# Patient Record
Sex: Male | Born: 1980 | Race: White | Hispanic: No | Marital: Single | Smoking: Current every day smoker
Health system: Southern US, Community
[De-identification: ages and names within clinical notes are randomized; demographics above are authoritative.]

## PROBLEM LIST (undated history)

## (undated) DIAGNOSIS — F419 Anxiety disorder, unspecified: Secondary | ICD-10-CM

## (undated) DIAGNOSIS — M1990 Unspecified osteoarthritis, unspecified site: Secondary | ICD-10-CM

## (undated) DIAGNOSIS — M2550 Pain in unspecified joint: Secondary | ICD-10-CM

## (undated) HISTORY — DX: Anxiety disorder, unspecified: F41.9

## (undated) HISTORY — DX: Pain in unspecified joint: M25.50

---

## 2014-01-19 ENCOUNTER — Encounter (HOSPITAL_COMMUNITY): Payer: Self-pay | Admitting: Emergency Medicine

## 2014-01-19 ENCOUNTER — Emergency Department (HOSPITAL_COMMUNITY): Payer: Self-pay

## 2014-01-19 ENCOUNTER — Emergency Department (HOSPITAL_COMMUNITY)
Admission: EM | Admit: 2014-01-19 | Discharge: 2014-01-19 | Disposition: A | Discharge status: Home / Self Care | Payer: Self-pay | Attending: Emergency Medicine | Admitting: Emergency Medicine

## 2014-01-19 DIAGNOSIS — S2249XA Multiple fractures of ribs, unspecified side, initial encounter for closed fracture: Secondary | ICD-10-CM | POA: Insufficient documentation

## 2014-01-19 DIAGNOSIS — Y9356 Activity, jumping rope: Secondary | ICD-10-CM | POA: Insufficient documentation

## 2014-01-19 DIAGNOSIS — Y929 Unspecified place or not applicable: Secondary | ICD-10-CM | POA: Insufficient documentation

## 2014-01-19 DIAGNOSIS — W1789XA Other fall from one level to another, initial encounter: Secondary | ICD-10-CM | POA: Insufficient documentation

## 2014-01-19 DIAGNOSIS — Z7982 Long term (current) use of aspirin: Secondary | ICD-10-CM | POA: Insufficient documentation

## 2014-01-19 DIAGNOSIS — F17200 Nicotine dependence, unspecified, uncomplicated: Secondary | ICD-10-CM | POA: Insufficient documentation

## 2014-01-19 DIAGNOSIS — S2232XA Fracture of one rib, left side, initial encounter for closed fracture: Secondary | ICD-10-CM

## 2014-01-19 DIAGNOSIS — Z79899 Other long term (current) drug therapy: Secondary | ICD-10-CM | POA: Insufficient documentation

## 2014-01-19 MED ORDER — OXYCODONE-ACETAMINOPHEN 5-325 MG PO TABS
1.0000 | ORAL_TABLET | Freq: Once | ORAL | Status: AC
  Administered 2014-01-19: 1 via ORAL
  Filled 2014-01-19: qty 1

## 2014-01-19 MED ORDER — KETOROLAC TROMETHAMINE 60 MG/2ML IM SOLN
60.0000 mg | Freq: Once | INTRAMUSCULAR | Status: AC
  Administered 2014-01-19: 60 mg via INTRAMUSCULAR
  Filled 2014-01-19: qty 2

## 2014-01-19 MED ORDER — OXYCODONE-ACETAMINOPHEN 5-325 MG PO TABS: 1.0000 | ORAL_TABLET | Freq: Four times a day (QID) | ORAL | Status: DC | PRN

## 2014-01-19 MED ORDER — IBUPROFEN 600 MG PO TABS: 600.0000 mg | ORAL_TABLET | Freq: Four times a day (QID) | ORAL | Status: DC | PRN

## 2014-01-19 NOTE — ED Notes (Signed)
PA at the bedside.

## 2014-01-19 NOTE — Discharge Instructions (Signed)
Rib Fracture  A rib fracture is a break or crack in one of the bones of the ribs. The ribs are a group of long, curved bones that wrap around your chest and attach to your spine. They protect your lungs and other organs in the chest cavity. A broken or cracked rib is often painful, but most do not cause other problems. Most rib fractures heal on their own over time. However, rib fractures can be more serious if multiple ribs are broken or if broken ribs move out of place and push against other structures.  CAUSES   · A direct blow to the chest. For example, this could happen during contact sports, a car accident, or a fall against a hard object.  · Repetitive movements with high force, such as pitching a baseball or having severe coughing spells.  SYMPTOMS   · Pain when you breathe in or cough.  · Pain when someone presses on the injured area.  DIAGNOSIS   Your caregiver will perform a physical exam. Various imaging tests may be ordered to confirm the diagnosis and to look for related injuries. These tests may include a chest X-ray, computed tomography (CT), magnetic resonance imaging (MRI), or a bone scan.  TREATMENT   Rib fractures usually heal on their own in 1 3 months. The longer healing period is often associated with a continued cough or other aggravating activities. During the healing period, pain control is very important. Medication is usually given to control pain. Hospitalization or surgery may be needed for more severe injuries, such as those in which multiple ribs are broken or the ribs have moved out of place.   HOME CARE INSTRUCTIONS   · Avoid strenuous activity and any activities or movements that cause pain. Be careful during activities and avoid bumping the injured rib.  · Gradually increase activity as directed by your caregiver.  · Only take over-the-counter or prescription medications as directed by your caregiver. Do not take other medications without asking your caregiver first.  · Apply ice  to the injured area for the first 1 2 days after you have been treated or as directed by your caregiver. Applying ice helps to reduce inflammation and pain.  · Put ice in a plastic bag.  · Place a towel between your skin and the bag.    · Leave the ice on for 15 20 minutes at a time, every 2 hours while you are awake.  · Perform deep breathing as directed by your caregiver. This will help prevent pneumonia, which is a common complication of a broken rib. Your caregiver may instruct you to:  · Take deep breaths several times a day.  · Try to cough several times a day, holding a pillow against the injured area.  · Use a device called an incentive spirometer to practice deep breathing several times a day.  · Drink enough fluids to keep your urine clear or pale yellow. This will help you avoid constipation.    · Do not wear a rib belt or binder. These restrict breathing, which can lead to pneumonia.    SEEK IMMEDIATE MEDICAL CARE IF:   · You have a fever.    · You have difficulty breathing or shortness of breath.    · You develop a continual cough, or you cough up thick or bloody sputum.  · You feel sick to your stomach (nausea), throw up (vomit), or have abdominal pain.    · You have worsening pain not controlled with medications.      MAKE SURE YOU:  · Understand these instructions.  · Will watch your condition.  · Will get help right away if you are not doing well or get worse.  Document Released: 07/26/2005 Document Revised: 03/28/2013 Document Reviewed: 09/27/2012  [employer]® Patient Information ©2014 [employer].

## 2014-01-19 NOTE — ED Provider Notes (Signed)
CSN: [id number]     Arrival date & time 01/19/14  1102 History  This chart was scribed for non-physician practitioner, Junius FinnerErin O'Malley, PA-C,working with Audree CamelScott T Goldston, MD, by Karle PlumberJennifer Tensley, ED Scribe.  This patient was seen in room TR08C/TR08C and the patient's care was started at 11:32 AM.  Chief Complaint  Patient presents with  . Chest Pain   The history is provided by the patient. No language interpreter was used.   HPI Comments:  Jay Hudson is a 33 y.o. male who presents to the Emergency Department complaining of bilateral rib pain that started three days ago secondary to jumping off of a swing about 20 feet high [place]. pain is constant, sharp and aching, 8/10. Pt states he landed "like a toothpick" feet fist but says he was leaning more forward. He states he "got the wind knocked out" of him. He reports that deep inspiration and lying on the area makes the pain worse. He reports taking ASA with minimal relief. He denies h/o asthma or heart problems. He denies LOC, abdominal pain, numbness or tingling, back pain, or neck pain.   History reviewed. No pertinent past medical history. History reviewed. No pertinent past surgical history. History reviewed. No pertinent family history. History  Substance Use Topics  . Smoking status: Current Every Day Smoker -- 1.50 packs/day  . Smokeless tobacco: Not on file  . Alcohol Use: Not on file    Review of Systems  Gastrointestinal: Negative for abdominal pain.  Musculoskeletal: Positive for arthralgias (bilateral rib pain). Negative for back pain and neck pain.  Neurological: Negative for syncope and numbness.  All other systems reviewed and are negative.   Allergies  Review of patient's allergies indicates no known allergies.  Home Medications   Prior to Admission medications   Medication Sig Start Date End Date Taking? Authorizing Provider  aspirin 325 MG EC tablet Take 325 mg by mouth every 4 (four) hours as needed for  pain.   Yes Historical Provider, MD  ibuprofen (ADVIL,MOTRIN) 600 MG tablet Take 1 tablet (600 mg total) by mouth every 6 (six) hours as needed. 01/19/14   Junius FinnerErin O'Malley, PA-C  oxyCODONE-acetaminophen (PERCOCET/ROXICET) 5-325 MG per tablet Take 1-2 tablets by mouth every 6 (six) hours as needed for moderate pain or severe pain. 01/19/14   Junius FinnerErin O'Malley, PA-C   Triage Vitals: BP 145/88  Pulse 96  Temp(Src) 97.9 F (36.6 C) (Oral)  Resp 17  Wt 150 lb (68.04 kg)  SpO2 99% Physical Exam  Nursing note and vitals reviewed. Constitutional: He is oriented to person, place, and time. He appears well-developed and well-nourished.  HENT:  Head: Normocephalic and atraumatic.  Eyes: EOM are normal.  Neck: Normal range of motion.  Cardiovascular: Normal rate, regular rhythm and normal heart sounds.  Exam reveals no gallop and no friction rub.   No murmur heard. Pulmonary/Chest: Effort normal and breath sounds normal. No respiratory distress. He has no wheezes. He has no rales. He exhibits tenderness.  Right and left chest wall tenderness. No flail chest. No crepitus.  Musculoskeletal: Normal range of motion. He exhibits tenderness. He exhibits no edema.  Neurological: He is alert and oriented to person, place, and time.  Skin: Skin is warm and dry.  Psychiatric: He has a normal mood and affect. His behavior is normal.    ED Course  Procedures (including critical care time) DIAGNOSTIC STUDIES: Oxygen Saturation is 99% on RA, normal by my interpretation.   COORDINATION OF CARE: 11:34  AM- Will X-Ray ribs and prescribe pain medication. Pt verbalizes understanding and agrees to plan.  Medications  oxyCODONE-acetaminophen (PERCOCET/ROXICET) 5-325 MG per tablet 1 tablet (1 tablet Oral Given 01/19/14 1147)  ketorolac (TORADOL) injection 60 mg (60 mg Intramuscular Given 01/19/14 1148)    Labs Review Labs Reviewed - No data to display  Imaging Review Dg Chest 2 View  01/19/2014   CLINICAL DATA:   [facility]  EXAM: CHEST  2 VIEW  COMPARISON:  None.  FINDINGS: Normal heart size and mediastinal contours. No acute infiltrate or edema. No effusion or pneumothorax. No acute osseous findings. 3 mm calcification overlapping the left renal shadow, likely nephrolithiasis.  IMPRESSION: 1. No active cardiopulmonary disease. 2. Probable left nephrolithiasis.   Electronically Signed   By: Tiburcio PeaJonathan  Watts M.D.   On: 01/19/2014 13:03   Dg Ribs Bilateral  01/19/2014   CLINICAL DATA:  Rib pain.  EXAM: BILATERAL RIBS - 3+ VIEW  COMPARISON:  Chest x-ray 01/19/2014.  FINDINGS: No displaced right rib fracture noted. Nondisplaced left ninth rib fracture appears to be present. Callus formation noted about this fracture suggesting that it is old. A subtle nondisplaced fracture of the left posterior lateral seventh rib fracture cannot be excluded. Linear density left pulmonary apex, possibly scarring. Lung markings are noted superior to this linear density.  IMPRESSION: 1.  Nondisplaced left ninth rib fracture with callus formation.  2. Subtle nondisplaced fracture of the left posterior lateral seventh rib.   Electronically Signed   By: Maisie Fushomas  Register   On: 01/19/2014 13:10     EKG Interpretation None      MDM   Final diagnoses:  Closed fracture of rib of left side    pt c/o chest wall pain after falling onto water from rope swing about 2420ft in the air 3 days ago. Pain worse with movement, palpation and deep inspiration.  On exam, no respiratory distress. Lungs: CTAB. Tenderness to left and right flanks and mid-axillary regions over ribs 5-9.  No flail chest. No ecchymosis. Skin in tact.  Plain films: nondisplaced left 9th rib fx with callus formation and subtle nondisplaced fx of left posterior lateral 7th rib.  Will tx symptomatically for pain, Rx: percocet and ibuprofen. Also provided pt incentive spirometry. [facility] if not improving. Return  precautions provided. Pt verbalized understanding and agreement with tx plan.    I personally performed the services described in this documentation, which was scribed in my presence. The recorded information has been reviewed and is accurate.    Junius FinnerErin O'Malley, PA-C 01/20/14 [phone]

## 2014-01-19 NOTE — ED Notes (Signed)
Patient's Friend waiting in the room.

## 2014-01-19 NOTE — ED Notes (Signed)
Pt jumped off rope swing into water and is having rib pain since Wends afternoon. Cannot sleep on his side and c/o pain bilaterally. Hurts worse with deep inspiration. Thinks he may have broken a rib or 2. Knocked wind out of him when he hit water.

## 2014-01-19 NOTE — ED Notes (Signed)
Patient returned from X-ray 

## 2014-01-20 NOTE — ED Provider Notes (Signed)
Medical screening examination/treatment/procedure(s) were performed by non-physician practitioner and as supervising physician I was immediately available for consultation/collaboration.   EKG Interpretation None        Margi Edmundson T Zaiah Credeur, MD 01/20/14 0959 

## 2018-04-20 ENCOUNTER — Ambulatory Visit (INDEPENDENT_AMBULATORY_CARE_PROVIDER_SITE_OTHER): Payer: Self-pay | Admitting: Osteopathic Medicine

## 2018-04-20 ENCOUNTER — Encounter: Payer: Self-pay | Admitting: Osteopathic Medicine

## 2018-04-20 VITALS — BP 158/102 | HR 92 | Wt 168.0 lb

## 2018-04-20 DIAGNOSIS — Z8619 Personal history of other infectious and parasitic diseases: Secondary | ICD-10-CM | POA: Insufficient documentation

## 2018-04-20 DIAGNOSIS — F419 Anxiety disorder, unspecified: Secondary | ICD-10-CM

## 2018-04-20 DIAGNOSIS — M25512 Pain in left shoulder: Secondary | ICD-10-CM

## 2018-04-20 DIAGNOSIS — M25511 Pain in right shoulder: Secondary | ICD-10-CM

## 2018-04-20 DIAGNOSIS — G8929 Other chronic pain: Secondary | ICD-10-CM | POA: Insufficient documentation

## 2018-04-20 MED ORDER — NAPROXEN 500 MG PO TABS: 500.0000 mg | ORAL_TABLET | Freq: Two times a day (BID) | ORAL | 1 refills | Status: DC

## 2018-04-20 MED ORDER — DULOXETINE HCL 30 MG PO CPEP: 30.0000 mg | ORAL_CAPSULE | Freq: Every day | ORAL | 1 refills | Status: DC

## 2018-04-20 NOTE — Progress Notes (Signed)
HPI: Jay Hudson is a 37 y.o. male who  has a past medical history of Anxiety and Joint pain.  [facility] today, 04/20/18,  for chief complaint of: New to establish Shoulder  Depression HTN  Pleasant new patient here to establish care.  Works as an Personnel officerelectrician. 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  He had at least 1 or 2 x-rays of the shoulder, sounds like at some point he was diagnosed with degenerative disc disease. He is not on nay Rx medications at this time.   Shoulder pain: Ongoing for a few years.  Feels like it is deep in the joint.  Worse with overhead motions, which he of course makes frequently in his work as an Personnel officerelectrician.  Has been taking ibuprofen up to 800 mg 3-4 times a day for some time.  Helped somewhat.  Previously in physical therapy for this while he was incarcerated, he is diligent about keeping up with the exercises.  Depression/anxiety: See below for PHQ/GAD assessment.  Anxiety issues run in his family, he has been diagnosed with unspecified anxiety disorder in the past.  He was previously on Paxil for this years ago, did not really like this medication.   Hypertension: Blood pressure elevated on intake.  Patient states that it is usually high.  There is a family history of high blood pressure and diabetes.  No chest pain, pressure, shortness of breath.  Other medical history: Previous broken bones, history of kidney stones, history of shingles.  Brief review of preventive care: Patient is a current smoker, 22-pack-year history.   Declines STD screening today, sexually active with one male partner, identifies as cisgender heterosexual.     Past medical, surgical, social and family history reviewed:  Patient Active Problem List   Diagnosis Date Noted  . Chronic pain of both shoulders 04/20/2018  . Anxiety 04/20/2018  . History of shingles 04/20/2018   No past surgical  history on file.  Social History   Tobacco Use  . Smoking status: Current Every Day Smoker    Packs/day: 1.00    Years: 22.00    Pack years: 22.00  . Smokeless tobacco: Never Used  Substance Use Topics  . Alcohol use: Yes    Family History  Problem Relation Age of Onset  . Anxiety disorder Mother   . Depression Mother   . Hypertension Mother   . Anxiety disorder Father   . Anxiety disorder Sister      Current medication list and allergy/intolerance information reviewed:    No prescription meds.   No Known Allergies     Review of Systems:  Constitutional:  No  fever, no chills, No recent illness, No unintentional weight changes. No significant fatigue.   HEENT: No  headache, no vision change, no hearing change, No sore throat, No  sinus pressure  Cardiac: No  chest pain, No  pressure, No palpitations, No  Orthopnea  Respiratory:  No  shortness of breath. No  Cough  Gastrointestinal: No  abdominal pain, No  nausea, No  vomiting,  No  blood in stool, No  diarrhea, No  constipation   Musculoskeletal: + myalgia/arthralgia  Skin: No  Rash, No other wounds/concerning lesions  Genitourinary: No  incontinence, No  abnormal genital bleeding, No abnormal genital discharge  Hem/Onc: No  easy bruising/bleeding, No  abnormal lymph node  Endocrine: No cold intolerance,  No heat intolerance. No polyuria/polydipsia/polyphagia   Neurologic: No  weakness, No  dizziness, No  slurred speech/focal weakness/facial droop  Psychiatric: No  concerns with depression, +concerns with anxiety, No sleep problems, No mood problems  Exam:  BP (!) 158/102   Pulse 92   Wt 168 lb (76.2 kg)   Constitutional: VS see above. General Appearance: alert, well-developed, well-nourished, NAD  Eyes: Normal lids and conjunctive, non-icteric sclera  Ears, Nose, Mouth, Throat: MMM, Normal external inspection ears/nares/mouth/lips/gums.   Neck: No masses, trachea midline. No thyroid enlargement.    Respiratory: Normal respiratory effort. no wheeze, no rhonchi, no rales  Cardiovascular: S1/S2 normal, no murmur, no rub/gallop auscultated. RRR. No lower extremity edema.   Gastrointestinal: Nontender, no masses. No hepatomegaly, no splenomegaly. No hernia appreciated. Bowel sounds normal. Rectal exam deferred.   Musculoskeletal: Gait normal. No clubbing/cyanosis of digits. (+)empty can test bilaterally, (+)apprehansion bilaterally, (+)apley scratch bilaterally   Neurological: Normal balance/coordination. No tremor. No cranial nerve deficit on limited exam. Motor and sensation intact and symmetric. Cerebellar reflexes intact.   Skin: warm, dry, intact. No rash/ulcer. No concerning nevi or subq nodules on limited exam.    Psychiatric: Normal judgment/insight. Normal mood and affect. Oriented x3.      ASSESSMENT/PLAN:   Chronic pain of both shoulders - Plan: naproxen (NAPROSYN) 500 MG tablet, COMPLETE METABOLIC PANEL WITH GFR, Lipid panel  Anxiety - Plan: DULoxetine (CYMBALTA) 30 MG capsule, COMPLETE METABOLIC PANEL WITH GFR, Lipid panel   Meds ordered this encounter  Medications  . DULoxetine (CYMBALTA) 30 MG capsule    Sig: Take 1 capsule (30 mg total) by mouth daily.    Dispense:  30 capsule    Refill:  1  . DISCONTD: naproxen (NAPROSYN) 500 MG tablet    Sig: Take 1 tablet (500 mg total) by mouth 2 (two) times daily with a meal.    Dispense:  60 tablet    Refill:  1     Visit summary with medication list and pertinent instructions was printed for patient to review. All questions at time of visit were answered - patient instructed to contact office with any additional concerns. ER/RTC precautions were reviewed with the patient.   Follow-up plan: Return in about 4 weeks (around 05/18/2018) for recheck anxiety and BP w/ Dr A, and see sports med for shoulder (possible injection) .  Note: Total time spent 30 minutes, greater than 50% of the visit was spent face-to-face  counseling and coordinating care for the following: The primary encounter diagnosis was Chronic pain of both shoulders. A diagnosis of Anxiety was also pertinent to this visit.Marland Kitchen  Please note: voice recognition software was used to produce this document, and typos may escape review. Please contact Dr. Lyn Hollingshead for any needed clarifications.

## 2018-05-11 ENCOUNTER — Telehealth: Payer: Self-pay | Admitting: Osteopathic Medicine

## 2018-05-11 ENCOUNTER — Ambulatory Visit: Payer: Self-pay | Admitting: Osteopathic Medicine

## 2018-05-11 ENCOUNTER — Ambulatory Visit (INDEPENDENT_AMBULATORY_CARE_PROVIDER_SITE_OTHER): Payer: Self-pay

## 2018-05-11 ENCOUNTER — Encounter: Payer: Self-pay | Admitting: Family Medicine

## 2018-05-11 ENCOUNTER — Ambulatory Visit (INDEPENDENT_AMBULATORY_CARE_PROVIDER_SITE_OTHER): Payer: Self-pay | Admitting: Family Medicine

## 2018-05-11 VITALS — BP 141/100 | HR 108 | Ht 67.0 in | Wt 168.0 lb

## 2018-05-11 DIAGNOSIS — M25511 Pain in right shoulder: Secondary | ICD-10-CM

## 2018-05-11 DIAGNOSIS — G8929 Other chronic pain: Secondary | ICD-10-CM

## 2018-05-11 DIAGNOSIS — M19012 Primary osteoarthritis, left shoulder: Secondary | ICD-10-CM

## 2018-05-11 DIAGNOSIS — M25512 Pain in left shoulder: Secondary | ICD-10-CM

## 2018-05-11 DIAGNOSIS — M19011 Primary osteoarthritis, right shoulder: Secondary | ICD-10-CM

## 2018-05-11 MED ORDER — DICLOFENAC SODIUM 1 % TD GEL: 2.0000 g | Freq: Four times a day (QID) | TRANSDERMAL | 11 refills | Status: AC

## 2018-05-11 NOTE — Patient Instructions (Signed)
Thank you for coming in today. Use the topical diclofenac gel.  Recheck as needed.  Call or go to the ER if you develop a large red swollen joint with extreme pain or oozing puss.  Recheck with me as needed.    Arthritis Arthritis means joint pain. It can also mean joint disease. A joint is a place where bones come together. People who have arthritis may have:  Red joints.  Swollen joints.  Stiff joints.  Warm joints.  A fever.  A feeling of being sick.  Follow these instructions at home: Pay attention to any changes in your symptoms. Take these actions to help with your pain and swelling. Medicines  Take over-the-counter and prescription medicines only as told by your doctor.  Do not take aspirin for pain if your doctor says that you may have gout. Activity  Rest your joint if your doctor tells you to.  Avoid activities that make the pain worse.  Exercise your joint regularly as told by your doctor. Try doing exercises like: ? Swimming. ? Water aerobics. ? Biking. ? Walking. Joint Care   If your joint is swollen, keep it raised (elevated) if told by your doctor.  If your joint feels stiff in the morning, try taking a warm shower.  If you have diabetes, do not apply heat without asking your doctor.  If told, apply heat to the joint: ? Put a towel between the joint and the hot pack or heating pad. ? Leave the heat on the area for 20-30 minutes.  If told, apply ice to the joint: ? Put ice in a plastic bag. ? Place a towel between your skin and the bag. ? Leave the ice on for 20 minutes, 2-3 times per day.  Keep all follow-up visits as told by your doctor. Contact a doctor if:  The pain gets worse.  You have a fever. Get help right away if:  You have very bad pain in your joint.  You have swelling in your joint.  Your joint is red.  Many joints become painful and swollen.  You have very bad back pain.  Your leg is very weak.  You cannot  control your pee (urine) or poop (stool). This information is not intended to replace advice given to you by your health care provider. Make sure you discuss any questions you have with your health care provider. Document Released: 10/20/2009 Document Revised: 01/01/2016 Document Reviewed: 10/21/2014 Elsevier Interactive Patient Education  [employer].

## 2018-05-11 NOTE — Telephone Encounter (Signed)
Pt advised of imaging results.  

## 2018-05-11 NOTE — Telephone Encounter (Signed)
Pt returned your call. Thanks  °

## 2018-05-11 NOTE — Progress Notes (Signed)
Clarion Mooneyhan is a 37 y.o. male who presents to [facility] today for shoulder pain.  Zaidan has a several year history of bilateral chronic shoulder pain.  He notes pain occurs typically with overhead motion but does also occur at rest and does interfere with sleep.  He is been trying ibuprofen and other over-the-counter medications which helped only a little.  He works as Personnel officer and notes that pain is bothersome with overhead lifting.  He denies any recent injury.  He notes the pain is moderate and does interfere with his ability to work at times.  No fevers chills nausea vomiting or diarrhea.    ROS:  As above  Exam:  BP (!) 141/100   Pulse (!) 108   Ht 5\' 7"  (1.702 m)   Wt 168 lb (76.2 kg)   BMI 26.31 kg/m  General: Well Developed, well nourished, and in no acute distress.  Neuro/Psych: Alert and oriented x3, extra-ocular muscles intact, able to move all 4 extremities, sensation grossly intact. Skin: Warm and dry, no rashes noted.  Respiratory: Not using accessory muscles, speaking in full sentences, trachea midline.  Cardiovascular: Pulses palpable, no extremity edema. Abdomen: Does not appear distended. MSK:  C-spine: Nontender to spinal midline normal neck motion.  Right shoulder: Normal-appearing nontender.   Range of motion limited in external rotation to about 20 degrees beyond neutral position.  Abduction limited to about 110 degrees active and passive.  Internal rotation limited to lumbar spine. Mildly positive Hawkins and Neer's test. Strength is intact.  Left shoulder: Normal-appearing nontender. Range of motion limited external rotation 10 degrees beyond neutral position, internal rotation to lumbar spine, abduction to 100 degrees active and passive. Mildly positive Hawkins and Neer's test. Strength is intact.     Lab and Radiology Results No results found for this or any previous visit (from the past 72  hour(s)). Dg Shoulder Right  Result Date: 05/11/2018 CLINICAL DATA:  Bilateral shoulder pain.  No known injury or trauma. EXAM: RIGHT SHOULDER - 2+ VIEW COMPARISON:  None. FINDINGS: There is no fracture or dislocation. There is moderate osteoarthritis of the right glenohumeral joint with inferior marginal osteophytes. There are mild degenerative changes of the acromioclavicular joint. IMPRESSION: 1.  No acute osseous injury of the right shoulder. 2. 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 Electronically Signed   By: Elige Ko   On: 05/11/2018 09:44   Dg Shoulder Left  Result Date: 05/11/2018 CLINICAL DATA:  Chronic left shoulder pain EXAM: LEFT SHOULDER - 2+ VIEW COMPARISON:  None. FINDINGS: Moderate to advanced degenerative changes in the left shoulder with joint space narrowing and spurring in both the [facility] and glenohumeral joints. No acute bony abnormality. Specifically, no fracture, subluxation, or dislocation. 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 No acute bony abnormality. Electronically Signed   By: Charlett Nose M.D.   On: 05/11/2018 09:44    I personally (independently) visualized and performed the interpretation of the images attached in this note.  Procedure: Real-time Ultrasound Guided Injection of right GH joint  Device: GE Logiq E   Images permanently stored and available for review in the ultrasound unit. Verbal informed consent obtained.  Discussed risks and benefits of procedure. Warned about infection bleeding damage to structures skin hypopigmentation and fat atrophy among others. Patient expresses understanding and agreement Time-out conducted.   Noted no overlying erythema, induration, or other signs of local infection.   Skin prepped in a sterile fashion.  Local anesthesia: Topical Ethyl chloride.   With sterile technique and under real time ultrasound guidance:  40 mg of Kenalog and 3 mL of Marcaine injected easily.     Completed without difficulty   Pain partially  resolved suggesting accurate placement of the medication.   Advised to call if fevers/chills, erythema, induration, drainage, or persistent bleeding.   Images permanently stored and available for review in the ultrasound unit.  Impression: Technically successful ultrasound guided injection.       Assessment and Plan: 37 y.o. male with  Bilateral shoulder pain due to DJD as seen on x-ray.  I am not clear why Gianmarco has arthritis to this extent in his shoulder joints.  He had good response to intra-articular steroid injection of his right shoulder today immediately following injection.  Plan to proceed with diclofenac gel and a bit of watchful waiting if he has continued good response is reasonable to proceed with a left shoulder injection.  Recheck as needed.  Patient has a follow-up appointment with PCP in the near future to address blood pressure.    Orders Placed This Encounter  Procedures  . DG Shoulder Right    Standing Status:   Future    Number of Occurrences:   1    Standing Expiration Date:   07/12/2019    Order Specific Question:   Reason for Exam (SYMPTOM  OR DIAGNOSIS REQUIRED)    Answer:   pain shoulder BL    Order Specific Question:   Preferred imaging location?    Answer:   [facility]    Order Specific Question:   Radiology Contrast Protocol - do NOT remove file path    Answer:   \\[email or web address]  . DG Shoulder Left    Standing Status:   Future    Number of Occurrences:   1    Standing Expiration Date:   07/12/2019    Order Specific Question:   Reason for Exam (SYMPTOM  OR DIAGNOSIS REQUIRED)    Answer:   Pain shoulder BL    Order Specific Question:   Preferred imaging location?    Answer:   [facility]    Order Specific Question:   Radiology Contrast Protocol - do NOT remove file path    Answer:    \\[email or web address]   Meds ordered this encounter  Medications  . diclofenac sodium (VOLTAREN) 1 % GEL    Sig: Apply 2 g topically 4 (four) times daily. To affected joint.    Dispense:  100 g    Refill:  11    Historical information moved to improve visibility of documentation.  Past Medical History:  Diagnosis Date  . Anxiety   . Joint pain    No past surgical history on file. Social History   Tobacco Use  . Smoking status: Current Every Day Smoker    Packs/day: 1.00    Years: 22.00    Pack years: 22.00  . Smokeless tobacco: Never Used  Substance Use Topics  . Alcohol use: Yes   family history includes Anxiety disorder in his father, mother, and sister; Depression in his mother; Hypertension in his mother.  Medications: Current Outpatient Medications  Medication Sig Dispense Refill  . DULoxetine (CYMBALTA) 30 MG capsule Take 1 capsule (30 mg total) by mouth daily. 30 capsule 1  . ibuprofen (ADVIL,MOTRIN) 600 MG tablet Take 1 tablet (600 mg total) by mouth every 6 (six) hours as needed. 30 tablet 0  . diclofenac sodium (VOLTAREN) 1 % GEL Apply  2 g topically 4 (four) times daily. To affected joint. 100 g 11   No current facility-administered medications for this visit.    No Known Allergies    Discussed warning signs or symptoms. Please see discharge instructions. Patient expresses understanding.

## 2018-05-16 ENCOUNTER — Ambulatory Visit: Payer: Self-pay | Admitting: Osteopathic Medicine

## 2018-05-24 ENCOUNTER — Ambulatory Visit (INDEPENDENT_AMBULATORY_CARE_PROVIDER_SITE_OTHER): Payer: Self-pay | Admitting: Osteopathic Medicine

## 2018-05-24 ENCOUNTER — Encounter: Payer: Self-pay | Admitting: Osteopathic Medicine

## 2018-05-24 ENCOUNTER — Telehealth: Payer: Self-pay | Admitting: Family Medicine

## 2018-05-24 VITALS — BP 124/86 | HR 102 | Temp 98.3°F | Wt 168.0 lb

## 2018-05-24 DIAGNOSIS — F419 Anxiety disorder, unspecified: Secondary | ICD-10-CM

## 2018-05-24 MED ORDER — MELOXICAM 15 MG PO TABS: 15.0000 mg | ORAL_TABLET | Freq: Every day | ORAL | 1 refills | Status: AC

## 2018-05-24 MED ORDER — BUPROPION HCL ER (XL) 150 MG PO TB24: 150.0000 mg | ORAL_TABLET | ORAL | 0 refills | Status: AC

## 2018-05-24 NOTE — Progress Notes (Signed)
HPI: [facility] is a 37 y.o. male who  has a past medical history of Anxiety and Joint pain.  he presents to [facility] today, 05/24/18,  for chief complaint of:  Anxiety  Depression/anxiety: First discussed at his initial visit 04/20/2018.  See below for PHQ/GAD assessment.  Anxiety issues run in his family, he has been diagnosed with unspecified anxiety disorder in the past.  He was previously on Paxil for this years ago, did not really like this medication.  Opted to start Cymbalta given concomitant chronic pain issues.  Patient stopped this medication when he ran out and had some concerns for "male issues" on this medicine. Still having significant anxiety symptoms, more days than not, phobia/panic with loud noises, traffic, crowds, leaving the house.   Shoulders are still bothering him, seen by sports medicine couple weeks ago, injection helped at first but then patient was expensing some significant pain in that side.  Appreciate Dr. Denyse Amass checking in on the patient today, see his documentation for full details   Past medical history, surgical history, and family history reviewed.  Current medication list and allergy/intolerance information reviewed.   (See remainder of HPI, ROS, Phys Exam below)    ASSESSMENT/PLAN:   Anxiety - Side effects with Cymbalta, will trial Wellbutrin, strongly encouraged counseling, questionable PTSD?    Patient Instructions  Lets to stay off of the Cymbalta and try a different medication called Wellbutrin.  This should not have any problematic sexual side effects for you.  Best taken in the morning.  I would also strongly recommend considering getting you set up with a counselor/therapist to work with you on coping mechanisms for when you are having panic/anxiety symptoms.  The behavioral health office downstairs holds walk-in hours every Wednesday, or you can always call me to place the referral.   Follow-up plan:  Return in about 4 weeks (around 06/21/2018) for recheck anxiety .           ############################################ ############################################ ############################################ ############################################    Outpatient Encounter Medications as of 05/24/2018  Medication Sig  . diclofenac sodium (VOLTAREN) 1 % GEL Apply 2 g topically 4 (four) times daily. To affected joint.  . DULoxetine (CYMBALTA) 30 MG capsule Take 1 capsule (30 mg total) by mouth daily.  Marland Kitchen ibuprofen (ADVIL,MOTRIN) 600 MG tablet Take 1 tablet (600 mg total) by mouth every 6 (six) hours as needed.   No facility-administered encounter medications on file as of 05/24/2018.    No Known Allergies    Review of Systems:  Constitutional: No recent illness  HEENT: No  headache, no vision change  Cardiac: No  chest pain, No  pressure, No palpitations  Respiratory:  No  shortness of breath. No  Cough  Gastrointestinal: No  abdominal pain, no change on bowel habits  Musculoskeletal: No new myalgia/arthralgia  Skin: No  Rash  Hem/Onc: No  easy bruising/bleeding, No  abnormal lumps/bumps  Neurologic: No  weakness, No  Dizziness  Psychiatric: No  concerns with depression, +concerns with anxiety GAD 7 : Generalized Anxiety Score 05/24/2018 04/20/2018  Nervous, Anxious, on Edge 3 3  Control/stop worrying 3 2  Worry too much - different things 3 2  Trouble relaxing 3 3  Restless 1 0  Easily annoyed or irritable 3 0  Afraid - awful might happen 3 2  Total GAD 7 Score 19 12  Anxiety Difficulty Very difficult Very difficult    Depression screen [facility] 2/9 05/24/2018 04/20/2018  Decreased Interest  2 1  Down, Depressed, Hopeless 0 0  PHQ - 2 Score 2 1  Altered sleeping 0 0  Tired, decreased energy 0 1  Change in appetite 2 0  Feeling bad or failure about yourself  0 0  Trouble concentrating 2 2  Moving slowly or fidgety/restless 2 0  Suicidal thoughts 0 0   PHQ-9 Score 8 4  Difficult doing work/chores Very difficult Somewhat difficult    Exam:  BP 124/86   Pulse (!) 102   Temp 98.3 F (36.8 C) (Oral)   Wt 168 lb (76.2 kg)   BMI 26.31 kg/m   Constitutional: VS see above. General Appearance: alert, well-developed, well-nourished, NAD  Eyes: Normal lids and conjunctive, non-icteric sclera  Ears, Nose, Mouth, Throat: MMM, Normal external inspection ears/nares/mouth/lips/gums.  Neck: No masses, trachea midline.   Respiratory: Normal respiratory effort. no wheeze, no rhonchi, no rales  Cardiovascular: S1/S2 normal, no murmur, no rub/gallop auscultated. RRR.   Musculoskeletal: Gait normal. Symmetric and independent movement of all extremities  Neurological: Normal balance/coordination. No tremor.  Skin: warm, dry, intact.   Psychiatric: Normal judgment/insight. Normal mood and affect. Oriented x3.   Visit summary with medication list and pertinent instructions was printed for patient to review, advised to alert [place] if any changes needed. All questions at time of visit were answered - patient instructed to contact office with any additional concerns. ER/RTC precautions were reviewed with the patient and understanding verbalized.   Follow-up plan: Return in about 4 weeks (around 06/21/2018) for recheck anxiety .     Please note: voice recognition software was used to produce this document, and typos may escape review. Please contact Dr. Lyn Hollingshead for any needed clarifications.

## 2018-05-24 NOTE — Patient Instructions (Signed)
Lets to stay off of the Cymbalta and try a different medication called Wellbutrin.  This should not have any problematic sexual side effects for you.  Best taken in the morning.  I would also strongly recommend considering getting you set up with a counselor/therapist to work with you on coping mechanisms for when you are having panic/anxiety symptoms.  The behavioral health office downstairs holds walk-in hours every Wednesday, or you can always call me to place the referral.

## 2018-05-24 NOTE — Telephone Encounter (Signed)
Discussed continued shoulder pain.  Will apply for [facility] and then proceed with shoulder MRI and PT.  Switch from ibuprofen to meloxicam.

## 2018-06-07 ENCOUNTER — Telehealth: Payer: Self-pay | Admitting: Osteopathic Medicine

## 2018-06-07 NOTE — Telephone Encounter (Signed)
Please call patient: Just checking to see how he is doing on the Wellbutrin, let me know if any questions or problems

## 2018-06-07 NOTE — Telephone Encounter (Signed)
Left vm msg for pt to return call back regarding med update. Direct call info provided.  

## 2018-06-23 NOTE — Telephone Encounter (Signed)
Left vm msg for pt to return call back regarding med update. Direct call info provided.

## 2019-11-17 ENCOUNTER — Other Ambulatory Visit: Payer: Self-pay

## 2019-11-17 ENCOUNTER — Encounter (HOSPITAL_COMMUNITY): Payer: Self-pay | Admitting: Emergency Medicine

## 2019-11-17 ENCOUNTER — Emergency Department (HOSPITAL_COMMUNITY)
Admission: EM | Admit: 2019-11-17 | Discharge: 2019-11-17 | Disposition: A | Discharge status: Home / Self Care | Payer: BLUE CROSS/BLUE SHIELD | Attending: Emergency Medicine | Admitting: Emergency Medicine

## 2019-11-17 DIAGNOSIS — J019 Acute sinusitis, unspecified: Secondary | ICD-10-CM

## 2019-11-17 DIAGNOSIS — J029 Acute pharyngitis, unspecified: Secondary | ICD-10-CM | POA: Diagnosis not present

## 2019-11-17 DIAGNOSIS — R05 Cough: Secondary | ICD-10-CM | POA: Diagnosis not present

## 2019-11-17 DIAGNOSIS — R0981 Nasal congestion: Secondary | ICD-10-CM | POA: Diagnosis not present

## 2019-11-17 DIAGNOSIS — F17210 Nicotine dependence, cigarettes, uncomplicated: Secondary | ICD-10-CM | POA: Insufficient documentation

## 2019-11-17 DIAGNOSIS — Z20822 Contact with and (suspected) exposure to covid-19: Secondary | ICD-10-CM | POA: Insufficient documentation

## 2019-11-17 HISTORY — DX: Unspecified osteoarthritis, unspecified site: M19.90

## 2019-11-17 MED ORDER — DOXYCYCLINE HYCLATE 100 MG PO CAPS: 100.0000 mg | ORAL_CAPSULE | Freq: Two times a day (BID) | ORAL | 0 refills | Status: AC

## 2019-11-17 NOTE — ED Provider Notes (Signed)
[facility] Provider Note   CSN: [id number] Arrival date & time: 11/17/19  1827     History Chief Complaint  Patient presents with  . Sore Throat  . Cough    [facility] is a 39 y.o. male.  39 year old male presents with 1 week of nasal congestion and drainage.  Denies any fever or chills.  Visual changes.  No vomiting or diarrhea.  No ear pain.  Slight sore throat but can swallow okay.  Has been using over-the-counter medications without relief.  Has had a slight cough but denies any dyspnea.        Past Medical History:  Diagnosis Date  . Anxiety   . Arthritis   . Joint pain     Patient Active Problem List   Diagnosis Date Noted  . Chronic pain of both shoulders 04/20/2018  . Anxiety 04/20/2018  . History of shingles 04/20/2018    History reviewed. No pertinent surgical history.     Family History  Problem Relation Age of Onset  . Anxiety disorder Mother   . Depression Mother   . Hypertension Mother   . Anxiety disorder Father   . Anxiety disorder Sister     Social History   Tobacco Use  . Smoking status: Current Every Day Smoker    Packs/day: 1.00    Years: 22.00    Pack years: 22.00  . Smokeless tobacco: Never Used  Substance Use Topics  . Alcohol use: Yes  . Drug use: Never    Home Medications Prior to Admission medications   Medication Sig Start Date End Date Taking? Authorizing Provider  buPROPion (WELLBUTRIN XL) 150 MG 24 hr tablet Take 1 tablet (150 mg total) by mouth every morning. 05/24/18   Emeterio Reeve, DO  diclofenac sodium (VOLTAREN) 1 % GEL Apply 2 g topically 4 (four) times daily. To affected joint. 05/11/18   Gregor Hams, MD  meloxicam (MOBIC) 15 MG tablet Take 1 tablet (15 mg total) by mouth daily. 05/24/18   Gregor Hams, MD    Allergies    Patient has no known allergies.  Review of Systems   Review of Systems  All other systems reviewed and are negative.   Physical  Exam Updated Vital Signs BP (!) 137/103 (BP Location: Right Arm)   Pulse (!) 111   Temp 99.1 F (37.3 C) (Oral)   Resp 15   SpO2 98%   Physical Exam Vitals and nursing note reviewed.  Constitutional:      General: He is not in acute distress.    Appearance: Normal appearance. He is well-developed. He is not toxic-appearing.  HENT:     Head: Normocephalic and atraumatic.  Eyes:     General: Lids are normal.     Conjunctiva/sclera: Conjunctivae normal.     Pupils: Pupils are equal, round, and reactive to light.  Neck:     Thyroid: No thyroid mass.     Trachea: No tracheal deviation.  Cardiovascular:     Rate and Rhythm: Normal rate and regular rhythm.     Heart sounds: Normal heart sounds. No murmur. No gallop.   Pulmonary:     Effort: Pulmonary effort is normal. No respiratory distress.     Breath sounds: Normal breath sounds. No stridor. No decreased breath sounds, wheezing, rhonchi or rales.  Abdominal:     General: Bowel sounds are normal. There is no distension.     Palpations: Abdomen is soft.  Tenderness: There is no abdominal tenderness. There is no rebound.  Musculoskeletal:        General: No tenderness. Normal range of motion.     Cervical back: Normal range of motion and neck supple.  Skin:    General: Skin is warm and dry.     Findings: No abrasion or rash.  Neurological:     Mental Status: He is alert and oriented to person, place, and time.     GCS: GCS eye subscore is 4. GCS verbal subscore is 5. GCS motor subscore is 6.     Cranial Nerves: No cranial nerve deficit.     Sensory: No sensory deficit.  Psychiatric:        Speech: Speech normal.        Behavior: Behavior normal.     ED Results / Procedures / Treatments   Labs (all labs ordered are listed, but only abnormal results are displayed) Labs Reviewed  SARS CORONAVIRUS 2 (TAT 6-24 HRS)    EKG None  Radiology No results found.  Procedures Procedures (including critical care  time)  Medications Ordered in ED Medications - No data to display  ED Course  I have reviewed the triage vital signs and the nursing notes.  Pertinent labs & imaging results that were available during my care of the patient were reviewed by me and considered in my medical decision making (see chart for details).    MDM Rules/Calculators/A&P                      Patient with likely sinusitis and will treat with antibiotics.  Possibility for Covid and PCR sent.  Quarantine precautions will be given Final Clinical Impression(s) / ED Diagnoses Final diagnoses:  None    Rx / DC Orders ED Discharge Orders    None       Lorre Nick, MD 11/17/19 1934

## 2019-11-17 NOTE — ED Triage Notes (Signed)
C/o sore throat, productive cough with green phlegm, and green nasal drainage x 1 week.

## 2019-11-17 NOTE — ED Notes (Signed)
Patient verbalizes understanding of discharge instructions. Opportunity for questioning and answers were provided. Armband removed by staff, pt discharged from ED.  

## 2019-11-18 LAB — SARS CORONAVIRUS 2 (TAT 6-24 HRS): SARS Coronavirus 2: NEGATIVE

## 2020-11-12 ENCOUNTER — Other Ambulatory Visit: Payer: Self-pay

## 2020-11-12 ENCOUNTER — Emergency Department (HOSPITAL_COMMUNITY): Payer: BLUE CROSS/BLUE SHIELD

## 2020-11-12 ENCOUNTER — Emergency Department (HOSPITAL_COMMUNITY)
Admission: EM | Admit: 2020-11-12 | Discharge: 2020-11-12 | Disposition: A | Discharge status: Home / Self Care | Payer: BLUE CROSS/BLUE SHIELD | Attending: Emergency Medicine | Admitting: Emergency Medicine

## 2020-11-12 DIAGNOSIS — F17200 Nicotine dependence, unspecified, uncomplicated: Secondary | ICD-10-CM | POA: Diagnosis not present

## 2020-11-12 DIAGNOSIS — R109 Unspecified abdominal pain: Secondary | ICD-10-CM | POA: Diagnosis present

## 2020-11-12 DIAGNOSIS — N200 Calculus of kidney: Secondary | ICD-10-CM | POA: Diagnosis not present

## 2020-11-12 LAB — BASIC METABOLIC PANEL
Anion gap: 8 (ref 5–15)
BUN: 17 mg/dL (ref 6–20)
CO2: 24 mmol/L (ref 22–32)
Calcium: 9.7 mg/dL (ref 8.9–10.3)
Chloride: 106 mmol/L (ref 98–111)
Creatinine, Ser: 1.2 mg/dL (ref 0.61–1.24)
GFR, Estimated: >60 mL/min (ref >60)
Glucose, Bld: 115 mg/dL — ABNORMAL HIGH (ref 70–99)
Potassium: 3.8 mmol/L (ref 3.5–5.1)
Sodium: 138 mmol/L (ref 135–145)

## 2020-11-12 LAB — CBC
HCT: 44.5 % (ref 39.0–52.0)
Hemoglobin: 14.8 g/dL (ref 13.0–17.0)
MCH: 29.9 pg (ref 26.0–34.0)
MCHC: 33.3 g/dL (ref 30.0–36.0)
MCV: 89.9 fL (ref 80.0–100.0)
Platelets: 374 K/uL (ref 150–400)
RBC: 4.95 MIL/uL (ref 4.22–5.81)
RDW: 13.4 % (ref 11.5–15.5)
WBC: 11.5 K/uL — ABNORMAL HIGH (ref 4.0–10.5)
nRBC: 0 % (ref 0.0–0.2)

## 2020-11-12 LAB — URINALYSIS, ROUTINE W REFLEX MICROSCOPIC
Bacteria, UA: NONE SEEN
Bilirubin Urine: NEGATIVE
Glucose, UA: NEGATIVE mg/dL
Ketones, ur: NEGATIVE mg/dL
Leukocytes,Ua: NEGATIVE
Nitrite: NEGATIVE
Protein, ur: NEGATIVE mg/dL
RBC / HPF: >50 RBC/hpf — ABNORMAL HIGH (ref 0–5)
Specific Gravity, Urine: 1.017 (ref 1.005–1.030)
pH: 5 (ref 5.0–8.0)

## 2020-11-12 MED ORDER — HYDROCODONE-ACETAMINOPHEN 5-325 MG PO TABS
1.0000 | ORAL_TABLET | Freq: Once | ORAL | Status: AC
  Administered 2020-11-12: 1 via ORAL
  Filled 2020-11-12: qty 1

## 2020-11-12 MED ORDER — HYDROCODONE-ACETAMINOPHEN 5-325 MG PO TABS: 1.0000 | ORAL_TABLET | Freq: Four times a day (QID) | ORAL | 0 refills | Status: AC | PRN

## 2020-11-12 MED ORDER — FENTANYL CITRATE (PF) 100 MCG/2ML IJ SOLN
50.0000 ug | Freq: Once | INTRAMUSCULAR | Status: AC
  Administered 2020-11-12: 50 ug via INTRAVENOUS
  Filled 2020-11-12: qty 2

## 2020-11-12 MED ORDER — ONDANSETRON 4 MG PO TBDP
4.0000 mg | ORAL_TABLET | Freq: Once | ORAL | Status: AC
  Administered 2020-11-12: 4 mg via ORAL
  Filled 2020-11-12: qty 1

## 2020-11-12 MED ORDER — OXYCODONE-ACETAMINOPHEN 5-325 MG PO TABS
1.0000 | ORAL_TABLET | Freq: Once | ORAL | Status: AC
  Administered 2020-11-12: 1 via ORAL
  Filled 2020-11-12: qty 1

## 2020-11-12 MED ORDER — HYDROCODONE-ACETAMINOPHEN 5-325 MG PO TABS: 1.0000 | ORAL_TABLET | Freq: Four times a day (QID) | ORAL | 0 refills | Status: DC | PRN

## 2020-11-12 MED ORDER — KETOROLAC TROMETHAMINE 30 MG/ML IJ SOLN
15.0000 mg | Freq: Once | INTRAMUSCULAR | Status: AC
  Administered 2020-11-12: 15 mg via INTRAVENOUS
  Filled 2020-11-12: qty 1

## 2020-11-12 MED ORDER — SODIUM CHLORIDE 0.9 % IV BOLUS
1000.0000 mL | Freq: Once | INTRAVENOUS | Status: AC
  Administered 2020-11-12: 1000 mL via INTRAVENOUS

## 2020-11-12 MED ORDER — ONDANSETRON 4 MG PO TBDP: 4.0000 mg | ORAL_TABLET | Freq: Three times a day (TID) | ORAL | 0 refills | Status: DC | PRN

## 2020-11-12 MED ORDER — ONDANSETRON 4 MG PO TBDP: 4.0000 mg | ORAL_TABLET | Freq: Three times a day (TID) | ORAL | 0 refills | Status: AC | PRN

## 2020-11-12 MED ORDER — IBUPROFEN 400 MG PO TABS: 400.0000 mg | ORAL_TABLET | Freq: Three times a day (TID) | ORAL | 0 refills | Status: DC

## 2020-11-12 MED ORDER — IBUPROFEN 400 MG PO TABS: 400.0000 mg | ORAL_TABLET | Freq: Three times a day (TID) | ORAL | 0 refills | Status: AC

## 2020-11-12 NOTE — ED Provider Notes (Signed)
[facility] EMERGENCY DEPARTMENT Provider Note   CSN: [id number] Arrival date & time: 11/12/20  1013     History Chief Complaint  Patient presents with  . Flank Pain    Jay Hudson is a 40 y.o. male.  HPI Patient presents with left flank pain.  Onset was yesterday, without obvious precipitant.  Since that time he has had pain focally in the left flank.  There was one episode of hematuria near the onset of his pain, but none since that time. No fever.  There is some nausea associated with the pain.  Is unclear what he has taken for pain control.  Pain is been persistent, sharp, severe, focal since onset.    Past Medical History:  Diagnosis Date  . Anxiety   . Arthritis   . Joint pain     Patient Active Problem List   Diagnosis Date Noted  . Chronic pain of both shoulders 04/20/2018  . Anxiety 04/20/2018  . History of shingles 04/20/2018    No past surgical history on file.     Family History  Problem Relation Age of Onset  . Anxiety disorder Mother   . Depression Mother   . Hypertension Mother   . Anxiety disorder Father   . Anxiety disorder Sister     Social History   Tobacco Use  . Smoking status: Current Every Day Smoker    Packs/day: 1.00    Years: 22.00    Pack years: 22.00  . Smokeless tobacco: Never Used  Vaping Use  . Vaping Use: Never used  Substance Use Topics  . Alcohol use: Yes  . Drug use: Never    Home Medications Prior to Admission medications   Medication Sig Start Date End Date Taking? Authorizing Provider  HYDROcodone-acetaminophen (NORCO/VICODIN) 5-325 MG tablet Take 1 tablet by mouth every 6 (six) hours as needed for severe pain. 11/12/20  Yes Gerhard Munch, MD  ibuprofen (ADVIL) 400 MG tablet Take 1 tablet (400 mg total) by mouth 3 (three) times daily for 3 days. Take one tablet three times daily for three days 11/12/20 11/15/20 Yes Gerhard Munch, MD  ondansetron (ZOFRAN ODT) 4 MG disintegrating tablet Take 1  tablet (4 mg total) by mouth every 8 (eight) hours as needed for nausea or vomiting. 11/12/20  Yes Gerhard Munch, MD  buPROPion (WELLBUTRIN XL) 150 MG 24 hr tablet Take 1 tablet (150 mg total) by mouth every morning. 05/24/18   Sunnie Nielsen, DO  diclofenac sodium (VOLTAREN) 1 % GEL Apply 2 g topically 4 (four) times daily. To affected joint. 05/11/18   Rodolph Bong, MD  doxycycline (VIBRAMYCIN) 100 MG capsule Take 1 capsule (100 mg total) by mouth 2 (two) times daily. 11/17/19   Lorre Nick, MD  meloxicam (MOBIC) 15 MG tablet Take 1 tablet (15 mg total) by mouth daily. 05/24/18   Rodolph Bong, MD    Allergies    Patient has no known allergies.  Review of Systems   Review of Systems  Constitutional:       Per HPI, otherwise negative  HENT:       Per HPI, otherwise negative  Respiratory:       Per HPI, otherwise negative  Cardiovascular:       Per HPI, otherwise negative  Gastrointestinal: Negative for vomiting.  Endocrine:       Negative aside from HPI  Genitourinary:       Neg aside from HPI   Musculoskeletal:  Per HPI, otherwise negative  Skin: Negative.   Neurological: Negative for syncope.    Physical Exam Updated Vital Signs BP (!) 153/108   Pulse 88   Temp 97.8 F (36.6 C) (Oral)   Resp (!) 23   SpO2 97%   Physical Exam Vitals and nursing note reviewed.  Constitutional:      General: He is not in acute distress.    Appearance: He is well-developed.  HENT:     Head: Normocephalic and atraumatic.  Eyes:     Conjunctiva/sclera: Conjunctivae normal.  Cardiovascular:     Rate and Rhythm: Normal rate and regular rhythm.  Pulmonary:     Effort: Pulmonary effort is normal. No respiratory distress.     Breath sounds: No stridor.  Abdominal:     General: There is no distension.     Tenderness: There is no abdominal tenderness. There is no guarding.  Skin:    General: Skin is warm and dry.  Neurological:     Mental Status: He is alert and oriented  to person, place, and time.     ED Results / Procedures / Treatments   Labs (all labs ordered are listed, but only abnormal results are displayed) Labs Reviewed  URINALYSIS, ROUTINE W REFLEX MICROSCOPIC - Abnormal; Notable for the following components:      Result Value   APPearance HAZY (*)    Hgb urine dipstick LARGE (*)    RBC / HPF >50 (*)    All other components within normal limits  BASIC METABOLIC PANEL - Abnormal; Notable for the following components:   Glucose, Bld 115 (*)    All other components within normal limits  CBC - Abnormal; Notable for the following components:   WBC 11.5 (*)    All other components within normal limits    Radiology CT Renal Stone Study  Result Date: 11/12/2020 CLINICAL DATA:  Left flank pain, history of kidney stones EXAM: CT ABDOMEN AND PELVIS WITHOUT CONTRAST TECHNIQUE: Multidetector CT imaging of the abdomen and pelvis was performed following the standard protocol without IV contrast. COMPARISON:  None. FINDINGS: Lower chest: Clustered cystic change in the left lower lobe. Hepatobiliary: No focal liver abnormality is seen. No gallstones, gallbladder wall thickening, or biliary dilatation. Pancreas: Unremarkable. Spleen: Unremarkable. Adrenals/Urinary Tract: Adrenals are unremarkable. There is an obstructing calculus within the proximal left ureter measuring approximately 5 mm. Mild hydronephrosis. Nonobstructing right renal calculi measuring up to 3 mm. Punctate nonobstructing left renal calculi. Bladder is unremarkable. Stomach/Bowel: Stomach is within normal limits. Bowel is normal in caliber. Normal appendix. Vascular/Lymphatic: Mild aortic atherosclerosis. No enlarged lymph nodes. Reproductive: Unremarkable. Other: No free fluid.  Abdominal wall is unremarkable. Musculoskeletal: No acute osseous abnormality. Chronic T11 compression fracture. IMPRESSION: Obstructing 5 mm proximal left ureteral calculus with mild hydronephrosis. Bilateral  nonobstructing renal calculi. Mild aortic atherosclerosis. Chronic T11 compression fracture Electronically Signed   By: Guadlupe Spanish M.D.   On: 11/12/2020 13:28    Procedures Procedures   Medications Ordered in ED Medications  ondansetron (ZOFRAN-ODT) disintegrating tablet 4 mg (4 mg Oral Given 11/12/20 1139)  oxyCODONE-acetaminophen (PERCOCET/ROXICET) 5-325 MG per tablet 1 tablet (1 tablet Oral Given 11/12/20 1139)  sodium chloride 0.9 % bolus 1,000 mL (0 mLs Intravenous Stopped 11/12/20 1410)  fentaNYL (SUBLIMAZE) injection 50 mcg (50 mcg Intravenous Given 11/12/20 1213)  ketorolac (TORADOL) 30 MG/ML injection 15 mg (15 mg Intravenous Given 11/12/20 1213)  HYDROcodone-acetaminophen (NORCO/VICODIN) 5-325 MG per tablet 1 tablet (1 tablet Oral Given 11/12/20  [id number])    ED Course  I have reviewed the triage vital signs and the nursing notes.  Pertinent labs & imaging results that were available during my care of the patient were reviewed by me and considered in my medical decision making (see chart for details).  Update:, Patient improved.  Reviewed his CT scan, notable for 5 mm ureteral stone. 3:08 PM Patient substantially better. He and I discussed all findings again. No evidence for complete obstruction, no infection, little suspicion for bacteremia, sepsis. Patient nonperitoneal, hemodynamically unremarkable, voiced understanding of need to follow-up with urology, discharged in stable condition with kidney stone.  Final Clinical Impression(s) / ED Diagnoses Final diagnoses:  Nephrolithiasis    Rx / DC Orders ED Discharge Orders         Ordered    HYDROcodone-acetaminophen (NORCO/VICODIN) 5-325 MG tablet  Every 6 hours PRN        11/12/20 1506    ibuprofen (ADVIL) 400 MG tablet  3 times daily        11/12/20 1506    ondansetron (ZOFRAN ODT) 4 MG disintegrating tablet  Every 8 hours PRN        11/12/20 1506           Gerhard Munch, MD 11/12/20 [phone]

## 2020-11-12 NOTE — Discharge Instructions (Addendum)
As discussed, you have been diagnosed with a kidney stone.  Please follow-up with our urology colleagues.  Below is the interpretation of today's CT scan: IMPRESSION:  Obstructing 5 mm proximal left ureteral calculus with mild  hydronephrosis.   Bilateral nonobstructing renal calculi.   Mild aortic atherosclerosis.   Chronic T11 compression fracture    Electronically Signed    By: Guadlupe Spanish M.D.    On: 11/12/2020 13:28

## 2020-11-12 NOTE — ED Triage Notes (Signed)
Emergency Medicine Provider Triage Evaluation Note  Jay Hudson , a 40 y.o. male  was evaluated in triage.  Pt complains of left flank pain onset yesterday, not worse with movement, similar to prior kidney stones, associated with nausea, reports episode of hematuria yesterday, no dysuria frequency today.  Denies fevers.  States that he has had stents in the past however they have a hard time finding them because they are not visible on x-ray..  Review of Systems  Positive: Flank pain, hematuria, vomiting Negative: Changes in bowel habits, fevers  Physical Exam  BP (!) 153/106 (BP Location: Right Arm)   Pulse (!) 107   Temp 97.9 F (36.6 C)   Resp (!) 22   SpO2 100%  Gen:   Awake, pacing the room, moaning HEENT:  Atraumatic  Resp:  Normal effort  Cardiac: Tachycardic Abd:   Nondistended, nontender  MSK:   Moves extremities without difficulty, normal range of motion lumbar spine, left CVA tenderness Neuro:  Speech clear   Medical Decision Making  Medically screening exam initiated at 10:30 AM.  Appropriate orders placed.  Jay Hudson was informed that the remainder of the evaluation will be completed by another provider, this initial triage assessment does not replace that evaluation, and the importance of remaining in the ED until their evaluation is complete.  Clinical Impression  Ureterolithiasis, ureteral colic, UTI   Jeannie Fend, PA-C 11/12/20 1037

## 2020-11-12 NOTE — ED Triage Notes (Signed)
Pt reports since yesterday morning he started having bilateral flank pain. Pt reports blood in his urine. Pt reports I think I might have a kidney infection.

## 2022-08-09 DIAGNOSIS — Z419 Encounter for procedure for purposes other than remedying health state, unspecified: Secondary | ICD-10-CM | POA: Diagnosis not present

## 2022-09-09 DIAGNOSIS — Z419 Encounter for procedure for purposes other than remedying health state, unspecified: Secondary | ICD-10-CM | POA: Diagnosis not present

## 2022-10-08 DIAGNOSIS — Z419 Encounter for procedure for purposes other than remedying health state, unspecified: Secondary | ICD-10-CM | POA: Diagnosis not present

## 2022-10-15 IMAGING — CT CT RENAL STONE PROTOCOL
2 of 4 series · 17 of 46 positions shown, 19 images · non-contrast
Comparison: None.

CLINICAL DATA: Left flank pain, history of kidney stones

EXAM:
CT ABDOMEN AND PELVIS WITHOUT CONTRAST
TECHNIQUE: Multidetector CT imaging of the abdomen and pelvis was performed
following the standard protocol without IV contrast.

[Series 3: ap without · axial · non-contrast · 0.69mm/px · z∈[-607,-212]mm · 14 of 91 slices shown, 16 images]
[im 6/91  soft-tissue]
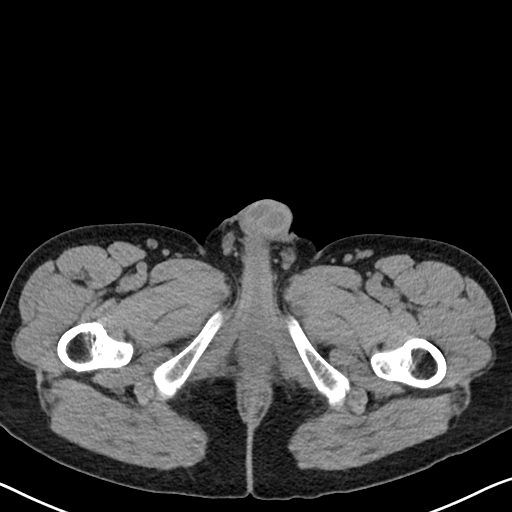
[im 6/91  bone]
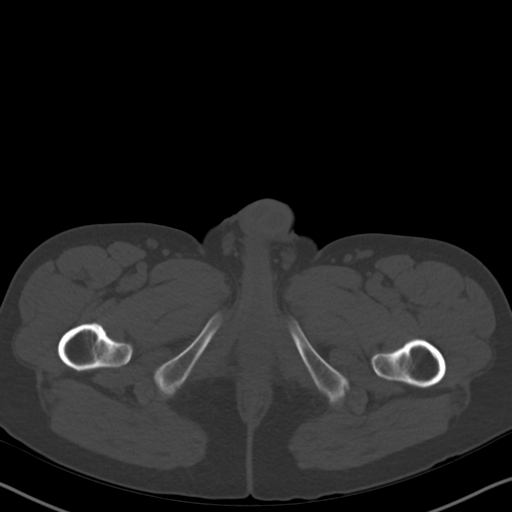
[im 11/91  soft-tissue]
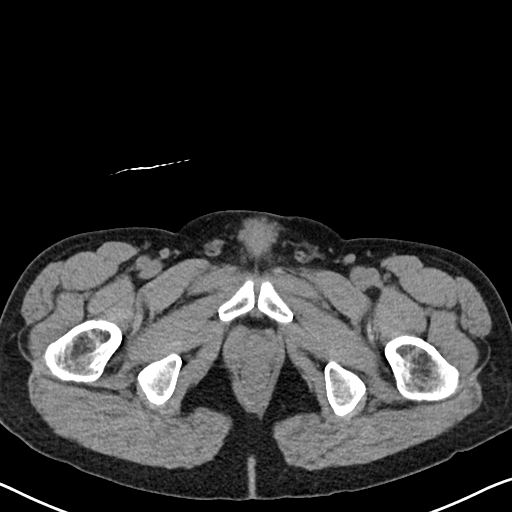
[im 16/91  soft-tissue]
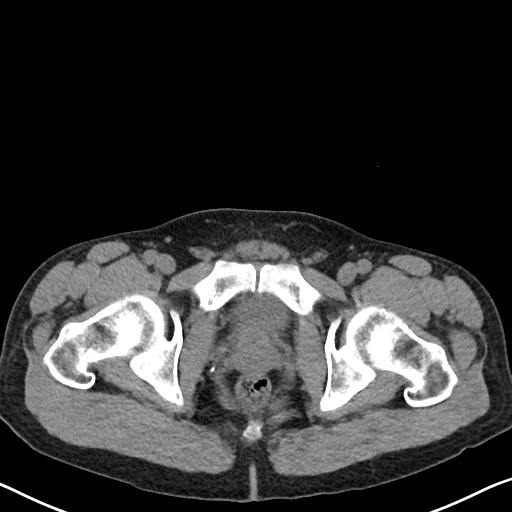
[im 27/91  soft-tissue]
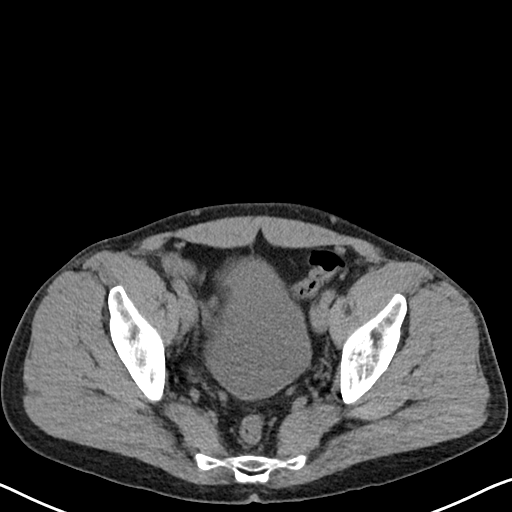
[im 32/91  soft-tissue]
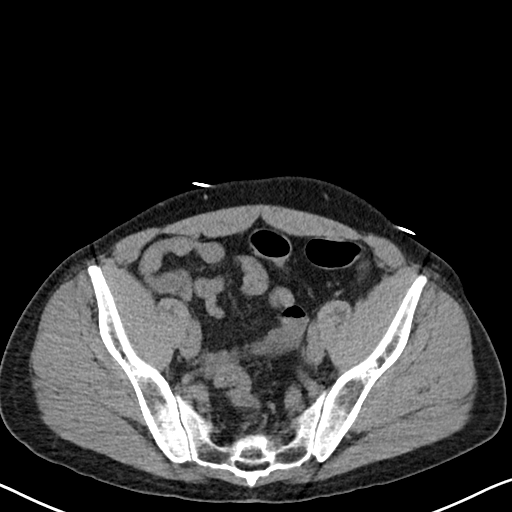
[im 38/91  soft-tissue]
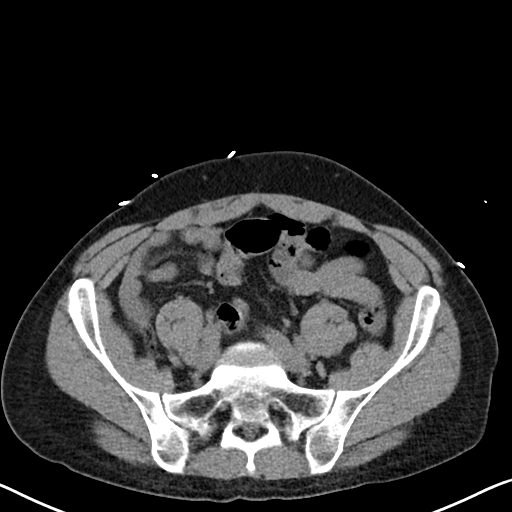
[im 43/91  soft-tissue]
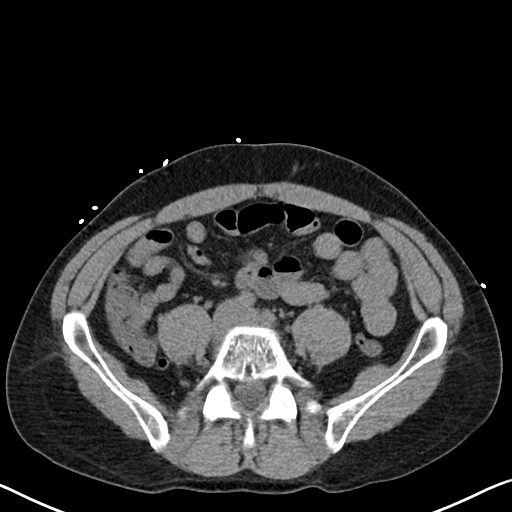
[im 48/91  soft-tissue]
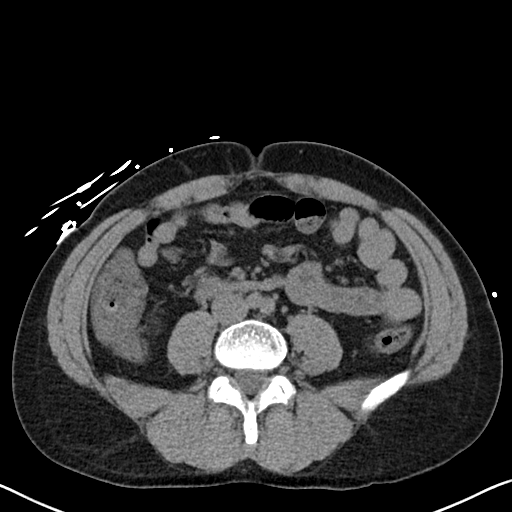
[im 53/91  soft-tissue]
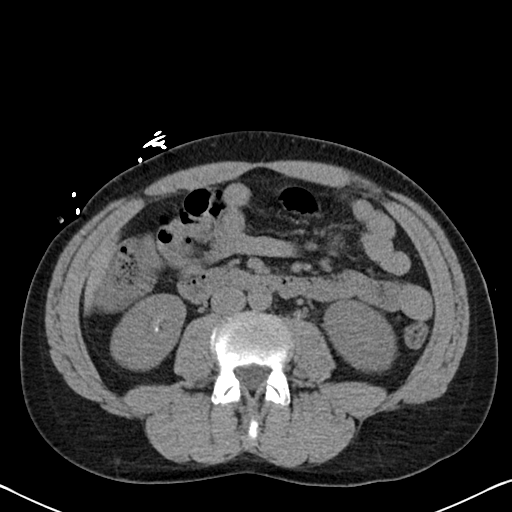
[im 53/91  bone]
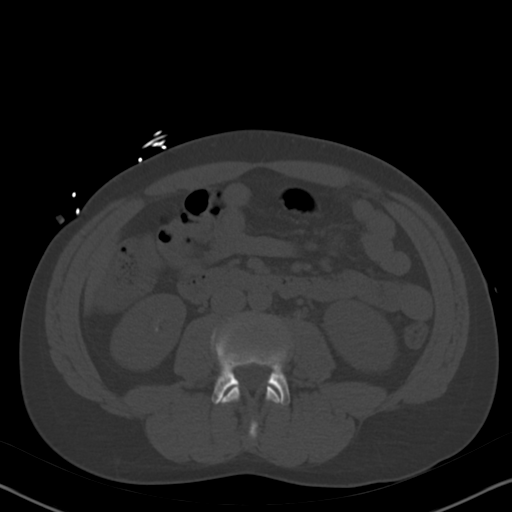
[im 59/91  soft-tissue]
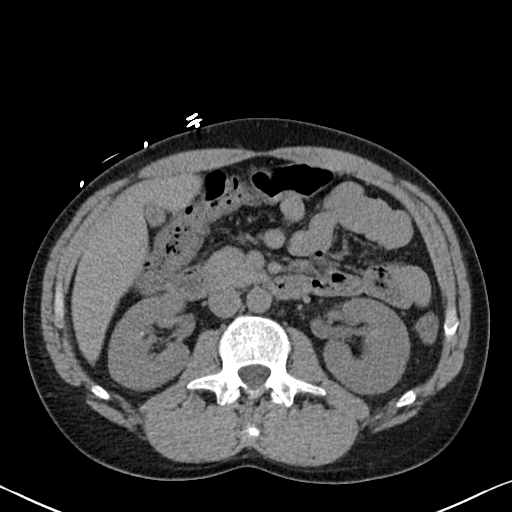
[im 69/91  soft-tissue]
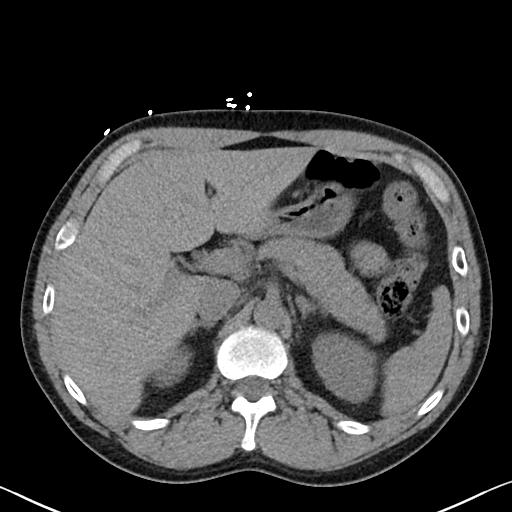
[im 75/91  soft-tissue]
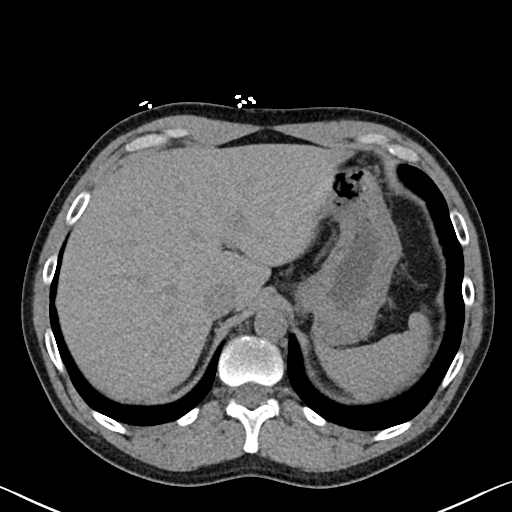
[im 80/91  soft-tissue]
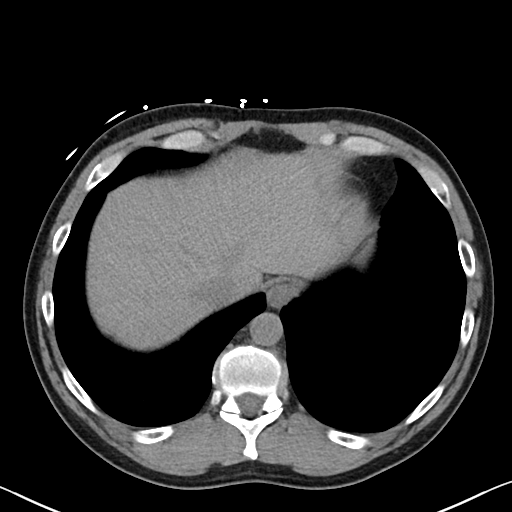
[im 85/91  soft-tissue]
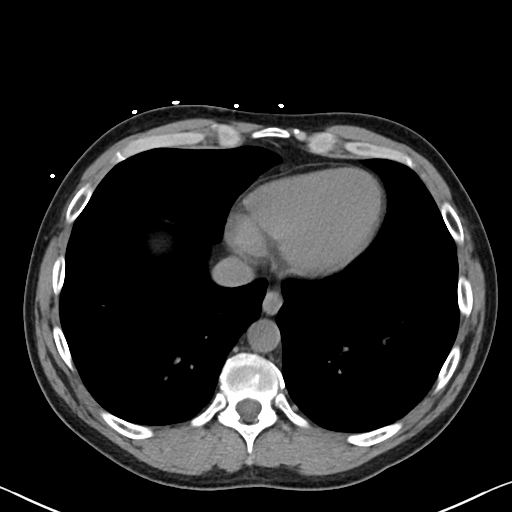

[Series 6: cor · coronal · 0.75mm/px · 3 of 95 slices shown]
[im 32/95  soft-tissue]
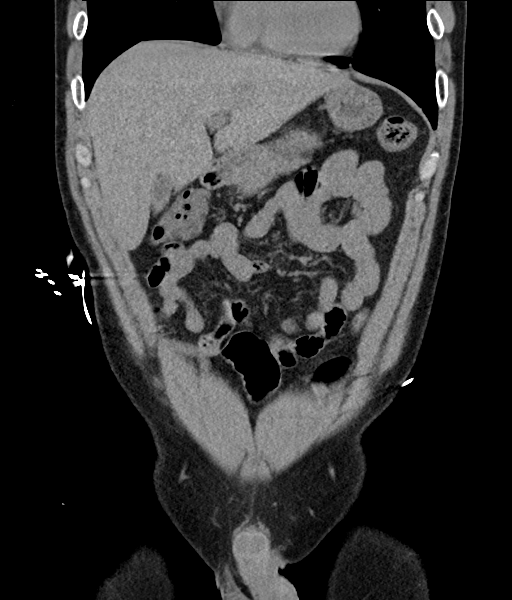
[im 42/95  soft-tissue]
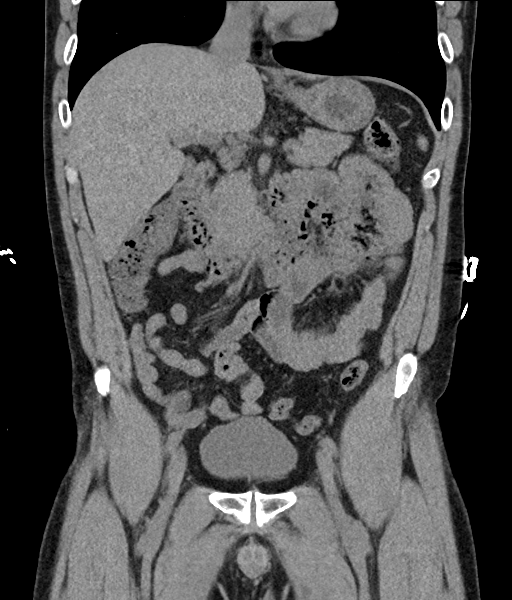
[im 53/95  soft-tissue]
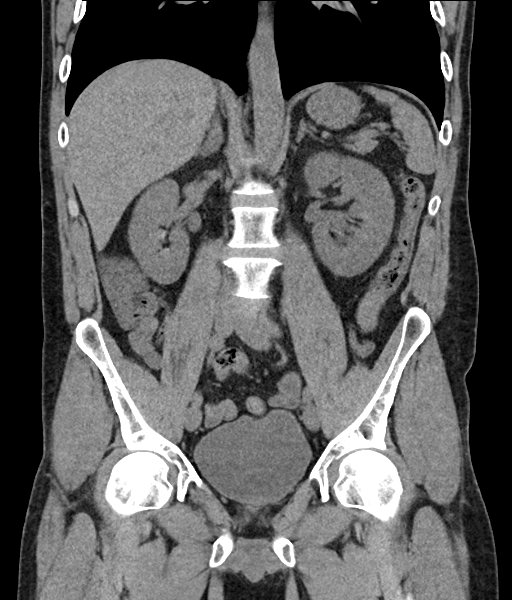

[17 of 46 positions shown; findings below may reference images not displayed]

FINDINGS: Lower chest: Clustered cystic change in the left lower lobe.

Hepatobiliary: No focal liver abnormality is seen. No gallstones,
gallbladder wall thickening, or biliary dilatation.

Pancreas: Unremarkable.

Spleen: Unremarkable.

Adrenals/Urinary Tract: Adrenals are unremarkable. There is an
obstructing calculus within the proximal left ureter measuring
approximately 5 mm. Mild hydronephrosis. Nonobstructing right renal
calculi measuring up to 3 mm. Punctate nonobstructing left renal
calculi. Bladder is unremarkable.

Stomach/Bowel: Stomach is within normal limits. Bowel is normal in
caliber. Normal appendix.

Vascular/Lymphatic: Mild aortic atherosclerosis. No enlarged lymph
nodes.

Reproductive: Unremarkable.

Other: No free fluid.  Abdominal wall is unremarkable.

Musculoskeletal: No acute osseous abnormality. Chronic T11
compression fracture.
IMPRESSION: Obstructing 5 mm proximal left ureteral calculus with mild
hydronephrosis.

Bilateral nonobstructing renal calculi.

Mild aortic atherosclerosis.

Chronic T11 compression fracture

## 2022-11-08 DIAGNOSIS — Z419 Encounter for procedure for purposes other than remedying health state, unspecified: Secondary | ICD-10-CM | POA: Diagnosis not present

## 2022-12-08 DIAGNOSIS — F1120 Opioid dependence, uncomplicated: Secondary | ICD-10-CM | POA: Diagnosis not present

## 2022-12-08 DIAGNOSIS — F1113 Opioid abuse with withdrawal: Secondary | ICD-10-CM | POA: Diagnosis not present

## 2022-12-08 DIAGNOSIS — F17210 Nicotine dependence, cigarettes, uncomplicated: Secondary | ICD-10-CM | POA: Diagnosis not present

## 2022-12-08 DIAGNOSIS — F1193 Opioid use, unspecified with withdrawal: Secondary | ICD-10-CM | POA: Diagnosis not present

## 2022-12-08 DIAGNOSIS — Z419 Encounter for procedure for purposes other than remedying health state, unspecified: Secondary | ICD-10-CM | POA: Diagnosis not present

## 2022-12-08 DIAGNOSIS — Z888 Allergy status to other drugs, medicaments and biological substances status: Secondary | ICD-10-CM | POA: Diagnosis not present

## 2022-12-15 ENCOUNTER — Telehealth: Payer: Self-pay

## 2022-12-15 NOTE — Telephone Encounter (Signed)
LVM for patient to call back. AS, CMA 

## 2023-01-08 DIAGNOSIS — Z419 Encounter for procedure for purposes other than remedying health state, unspecified: Secondary | ICD-10-CM | POA: Diagnosis not present

## 2023-02-07 DIAGNOSIS — Z419 Encounter for procedure for purposes other than remedying health state, unspecified: Secondary | ICD-10-CM | POA: Diagnosis not present

## 2023-03-10 DIAGNOSIS — Z419 Encounter for procedure for purposes other than remedying health state, unspecified: Secondary | ICD-10-CM | POA: Diagnosis not present

## 2023-04-10 DIAGNOSIS — Z419 Encounter for procedure for purposes other than remedying health state, unspecified: Secondary | ICD-10-CM | POA: Diagnosis not present

## 2023-05-04 ENCOUNTER — Emergency Department (HOSPITAL_COMMUNITY)
Admission: EM | Admit: 2023-05-04 | Discharge: 2023-05-04 | Disposition: A | Discharge status: Home / Self Care | Payer: 59

## 2023-05-04 ENCOUNTER — Other Ambulatory Visit: Payer: Self-pay

## 2023-05-04 ENCOUNTER — Emergency Department (HOSPITAL_COMMUNITY): Payer: 59

## 2023-05-04 DIAGNOSIS — R0781 Pleurodynia: Secondary | ICD-10-CM | POA: Insufficient documentation

## 2023-05-04 DIAGNOSIS — W1839XA Other fall on same level, initial encounter: Secondary | ICD-10-CM | POA: Diagnosis not present

## 2023-05-04 DIAGNOSIS — S42031A Displaced fracture of lateral end of right clavicle, initial encounter for closed fracture: Secondary | ICD-10-CM | POA: Insufficient documentation

## 2023-05-04 DIAGNOSIS — Y9301 Activity, walking, marching and hiking: Secondary | ICD-10-CM | POA: Insufficient documentation

## 2023-05-04 DIAGNOSIS — S4991XA Unspecified injury of right shoulder and upper arm, initial encounter: Secondary | ICD-10-CM | POA: Diagnosis present

## 2023-05-04 DIAGNOSIS — W19XXXA Unspecified fall, initial encounter: Secondary | ICD-10-CM

## 2023-05-04 MED ORDER — IBUPROFEN 200 MG PO TABS
600.0000 mg | ORAL_TABLET | Freq: Once | ORAL | Status: AC
  Administered 2023-05-04: 600 mg via ORAL
  Filled 2023-05-04: qty 3

## 2023-05-04 MED ORDER — IBUPROFEN 600 MG PO TABS: 600.0000 mg | ORAL_TABLET | Freq: Four times a day (QID) | ORAL | 0 refills | Status: AC | PRN

## 2023-05-04 NOTE — ED Provider Notes (Signed)
[facility] EMERGENCY DEPARTMENT AT [facility] Provider Note   CSN: [id number] Arrival date & time: 05/04/23  1247     History  Chief Complaint  Patient presents with   Shoulder Pain    Jay Hudson is a 42 y.o. male.   Shoulder Pain   42 year old male presents emergency department with complaints of right-sided shoulder pain as well as some right rib pain.  Patient states that he was walking his 59 month old [place] Shepherd when the dog pulled causing him to land on his right shoulder as well as right chest.  Patient reports pain with any movement of his right shoulder.  Denies any weakness or sensory deficits distal to right shoulder.  Denies traumatic, loss consciousness, blood thinner use.  Denies any shortness of breath, abdominal pain, nausea, vomiting.  Denies trauma elsewhere.  Past medical history significant for anxiety, arthritis  Home Medications Prior to Admission medications   Medication Sig Start Date End Date Taking? Authorizing Provider  ibuprofen (ADVIL) 600 MG tablet Take 1 tablet (600 mg total) by mouth every 6 (six) hours as needed. 05/04/23  Yes Sherian Maroon A, PA  buPROPion (WELLBUTRIN XL) 150 MG 24 hr tablet Take 1 tablet (150 mg total) by mouth every morning. 05/24/18   Sunnie Nielsen, DO  diclofenac sodium (VOLTAREN) 1 % GEL Apply 2 g topically 4 (four) times daily. To affected joint. 05/11/18   Rodolph Bong, MD  doxycycline (VIBRAMYCIN) 100 MG capsule Take 1 capsule (100 mg total) by mouth 2 (two) times daily. 11/17/19   Lorre Nick, MD  HYDROcodone-acetaminophen (NORCO/VICODIN) 5-325 MG tablet Take 1 tablet by mouth every 6 (six) hours as needed for severe pain. 11/12/20   Gerhard Munch, MD  meloxicam (MOBIC) 15 MG tablet Take 1 tablet (15 mg total) by mouth daily. 05/24/18   Rodolph Bong, MD  ondansetron (ZOFRAN ODT) 4 MG disintegrating tablet Take 1 tablet (4 mg total) by mouth every 8 (eight) hours as needed for nausea or vomiting.  11/12/20   Gerhard Munch, MD      Allergies    Patient has no known allergies.    Review of Systems   Review of Systems  All other systems reviewed and are negative.   Physical Exam Updated Vital Signs BP 131/73   Pulse (!) 103   Temp 98.1 F (36.7 C) (Oral)   Resp 18   Ht 5\' 7"  (1.702 m)   Wt 76.2 kg   SpO2 99%   BMI 26.31 kg/m  Physical Exam Vitals and nursing note reviewed.  Constitutional:      General: He is not in acute distress.    Appearance: He is well-developed.  HENT:     Head: Normocephalic and atraumatic.  Eyes:     Conjunctiva/sclera: Conjunctivae normal.  Cardiovascular:     Rate and Rhythm: Normal rate and regular rhythm.     Heart sounds: No murmur heard. Pulmonary:     Effort: Pulmonary effort is normal. No respiratory distress.     Breath sounds: Normal breath sounds. No wheezing, rhonchi or rales.  Abdominal:     Palpations: Abdomen is soft.     Tenderness: There is no abdominal tenderness.  Musculoskeletal:        General: No swelling.     Cervical back: Neck supple.     Comments: No tenderness of cervical, thoracic, lumbar spine with no obvious step-off or deformity noted.  Tenderness of right distal clavicle as well as right  proximal humerus but otherwise, no tenderness of bilateral upper or lower extremities.  Mild tenderness of right lateral ribs.  No obvious appreciable flail chest.  Skin:    General: Skin is warm and dry.     Capillary Refill: Capillary refill takes less than 2 seconds.  Neurological:     Mental Status: He is alert.  Psychiatric:        Mood and Affect: Mood normal.     ED Results / Procedures / Treatments   Labs (all labs ordered are listed, but only abnormal results are displayed) Labs Reviewed - No data to display  EKG None  Radiology DG Ribs Unilateral W/Chest Right  Result Date: 05/04/2023 CLINICAL DATA:  Fall, injury walking his dog, shoulder pain EXAM: RIGHT RIBS AND CHEST - 3+ VIEW COMPARISON:   05/04/2023, 01/19/2014 FINDINGS: There is redemonstration of a nondisplaced acute right distal clavicle fracture. Severe advanced bilateral shoulder arthropathy with joint space loss, sclerosis, humeral head deformity and osteophyte formations. Subacute anterolateral healing right sixth, seventh, and ninth rib fractures noted. Lungs remain clear. No focal pneumonia, collapse or consolidation. Negative for edema, effusion pneumothorax. Trachea midline. Degenerative changes of the thoracic spine as well. IMPRESSION: 1. Acute nondisplaced right distal clavicle fracture. 2. Subacute healing right anterolateral sixth, seventh, and ninth rib fractures. 3. Severe bilateral shoulder arthropathy. Electronically Signed   By: Judie Petit.  Shick M.D.   On: 05/04/2023 14:55   DG Shoulder Right  Result Date: 05/04/2023 CLINICAL DATA:  Fall, right shoulder pain EXAM: RIGHT SHOULDER - 2+ VIEW COMPARISON:  05/11/2018 FINDINGS: There is an acute minimally angulated fracture of the right distal clavicle. AC joint remains aligned without separation. Progressive advanced severe arthropathy of the right shoulder joint with sclerosis, humeral head deformity, joint space loss, and osteophyte formation. Included right chest unremarkable. IMPRESSION: 1. Acute minimally angulated right distal clavicle fracture. 2. Progressive advanced right shoulder arthropathy. Electronically Signed   By: Judie Petit.  Shick M.D.   On: 05/04/2023 14:38    Procedures Procedures    Medications Ordered in ED Medications  ibuprofen (ADVIL) tablet 600 mg (600 mg Oral Given 05/04/23 1337)    ED Course/ Medical Decision Making/ A&P                                 Medical Decision Making Amount and/or Complexity of Data Reviewed Radiology: ordered.  Risk OTC drugs. Prescription drug management.   This patient presents to the ED for concern of fall, this involves an extensive number of treatment options, and is a complaint that carries with it a high risk of  complications and morbidity.  The differential diagnosis includes fracture, strain/pain, dislocation, ligamentous/tendinous injury, neurovascular compromise, pneumothorax   Co morbidities that complicate the patient evaluation  See HPI   Additional history obtained:  Additional history obtained from EMR External records from outside source obtained and reviewed including hospital records   Lab Tests:  N/a   Imaging Studies ordered:  I ordered imaging studies including x-ray of right shoulder, chest x-ray with right ribs I independently visualized and interpreted imaging which showed  Right shoulder x-ray: Acute minimally angulated distal right clavicle fracture.  Progressive advanced right shoulder arthropathy. Chest x-ray with right ribs: Acute nondisplaced right distal clavicle fracture.  Subacute healing of anterior lateral sixth, seventh and ninth rib fracture.  Severe bilateral shoulder OA. I agree with the radiologist interpretation  Cardiac Monitoring: / EKG:  The patient  was maintained on a cardiac monitor.  I personally viewed and interpreted the cardiac monitored which showed an underlying rhythm of: sinus rhythm   Consultations Obtained:  N/a   Problem List / ED Course / Critical interventions / Medication management  Fall, right clavicle fracture I ordered medication including motrin  Reevaluation of the patient after these medicines showed that the patient improved I have reviewed the patients home medicines and have made adjustments as needed   Social Determinants of Health:  Chronic cigarette use.  Denies illicit drug use.   Test / Admission - Considered:  Fall, right clavicle fracture Vitals signs within normal range and stable throughout visit. Imaging studies significant for: See above 42 year old male presents emergency department after mechanical fall on his right side with complaints of right shoulder/collarbone pain.  On exam, patient with  obvious deformity of right-sided clavicle with limited range of motion secondary to pain.  No evidence of pulse deficits concerning for ischemic limb.  Patient without overlying skin changes concerning for secondary infectious process.  X-ray was performed which did show mildly displaced distal fracture of the provide clavicle which is most likely causing patient's symptoms.  Patient placed in shoulder sling will recommend close follow-up with orthopedics in the outpatient setting.  Symptomatic therapy recommended with rest, ice, NSAIDs.  Treatment plan discussed length with patient and he acknowledged understanding was agreeable to said plan.  Patient overall well-appearing, afebrile in no acute distress. Worrisome signs and symptoms were discussed with the patient, and the patient acknowledged understanding to return to the ED if noticed. Patient was stable upon discharge.          Final Clinical Impression(s) / ED Diagnoses Final diagnoses:  Fall, initial encounter  Closed displaced fracture of acromial end of right clavicle, initial encounter    Rx / DC Orders ED Discharge Orders          Ordered    ibuprofen (ADVIL) 600 MG tablet  Every 6 hours PRN        05/04/23 1459              Peter Garter, [place] 05/04/23 1534    Coral Spikes, DO 05/05/23 0703

## 2023-05-04 NOTE — Discharge Instructions (Signed)
As discussed, x-rays did show evidence of right-sided clavicle fracture or collarbone fracture.  Will treat this with sling initially heal with time.  Attached is information for orthopedics to follow-up with.  Please keep arm in sling until follow-up with orthopedics.  Recommend rest, ice, NSAIDs.  Please do not hesitate to return to emergency department if there are worrisome signs and symptoms we discussed become apparent.

## 2023-05-04 NOTE — Progress Notes (Signed)
Orthopedic Tech Progress Note Patient Details:  Jay Hudson 09-19-1980 [id number]  Ortho Devices Type of Ortho Device: Shoulder immobilizer Ortho Device/Splint Location: right Ortho Device/Splint Interventions: Ordered, Application, Adjustment   Post Interventions Patient Tolerated: Well Instructions Provided: Care of device, Adjustment of device  Kizzie Fantasia 05/04/2023, 2:55 PM

## 2023-05-04 NOTE — ED Triage Notes (Signed)
Pt c/o right shoulder/collar bone pain. Pt states he was walking his dog and had a mechanical fall landing on his right side.

## 2023-05-10 DIAGNOSIS — Z419 Encounter for procedure for purposes other than remedying health state, unspecified: Secondary | ICD-10-CM | POA: Diagnosis not present

## 2023-06-07 ENCOUNTER — Telehealth: Payer: Self-pay

## 2023-06-07 NOTE — Telephone Encounter (Signed)
Transition Care Management Unsuccessful Follow-up Telephone Call  Date of discharge and from where:  05/04/2023 [facility]  Attempts:  1st Attempt  Reason for unsuccessful TCM follow-up call:  No answer/busy  Isabel Freese [facility]  [facility], [facility] Resource Care Guide Direct Dial: [phone]  Website: [email or web address]

## 2023-06-08 ENCOUNTER — Telehealth: Payer: Self-pay

## 2023-06-08 NOTE — Telephone Encounter (Signed)
Transition Care Management Unsuccessful Follow-up Telephone Call  Date of discharge and from where:  05/04/2023 [facility]  Attempts:  2nd Attempt  Reason for unsuccessful TCM follow-up call:  Voice mail full  Nehemiah Mcfarren [facility]  [facility], [facility] Resource Care Guide Direct Dial: [phone]  Website: [email or web address]

## 2023-06-10 DIAGNOSIS — Z419 Encounter for procedure for purposes other than remedying health state, unspecified: Secondary | ICD-10-CM | POA: Diagnosis not present

## 2023-07-10 DIAGNOSIS — Z419 Encounter for procedure for purposes other than remedying health state, unspecified: Secondary | ICD-10-CM | POA: Diagnosis not present

## 2023-08-10 DIAGNOSIS — Z419 Encounter for procedure for purposes other than remedying health state, unspecified: Secondary | ICD-10-CM | POA: Diagnosis not present

## 2023-09-10 DIAGNOSIS — Z419 Encounter for procedure for purposes other than remedying health state, unspecified: Secondary | ICD-10-CM | POA: Diagnosis not present

## 2023-10-08 DIAGNOSIS — Z419 Encounter for procedure for purposes other than remedying health state, unspecified: Secondary | ICD-10-CM | POA: Diagnosis not present

## 2023-11-19 DIAGNOSIS — Z419 Encounter for procedure for purposes other than remedying health state, unspecified: Secondary | ICD-10-CM | POA: Diagnosis not present

## 2023-12-07 ENCOUNTER — Emergency Department (HOSPITAL_COMMUNITY)
Admission: EM | Admit: 2023-12-07 | Discharge: 2023-12-08 | Disposition: A | Discharge status: Home / Self Care | Attending: Emergency Medicine | Admitting: Emergency Medicine

## 2023-12-07 DIAGNOSIS — F1110 Opioid abuse, uncomplicated: Secondary | ICD-10-CM | POA: Insufficient documentation

## 2023-12-07 DIAGNOSIS — W01198A Fall on same level from slipping, tripping and stumbling with subsequent striking against other object, initial encounter: Secondary | ICD-10-CM | POA: Diagnosis not present

## 2023-12-07 DIAGNOSIS — S2241XA Multiple fractures of ribs, right side, initial encounter for closed fracture: Secondary | ICD-10-CM | POA: Diagnosis not present

## 2023-12-07 DIAGNOSIS — S42031K Displaced fracture of lateral end of right clavicle, subsequent encounter for fracture with nonunion: Secondary | ICD-10-CM | POA: Diagnosis not present

## 2023-12-07 DIAGNOSIS — S2241XK Multiple fractures of ribs, right side, subsequent encounter for fracture with nonunion: Secondary | ICD-10-CM | POA: Diagnosis not present

## 2023-12-07 DIAGNOSIS — S29001A Unspecified injury of muscle and tendon of front wall of thorax, initial encounter: Secondary | ICD-10-CM | POA: Diagnosis present

## 2023-12-08 ENCOUNTER — Other Ambulatory Visit: Payer: Self-pay

## 2023-12-08 ENCOUNTER — Emergency Department (HOSPITAL_COMMUNITY)

## 2023-12-08 ENCOUNTER — Encounter (HOSPITAL_COMMUNITY): Payer: Self-pay | Admitting: *Deleted

## 2023-12-08 DIAGNOSIS — S42031K Displaced fracture of lateral end of right clavicle, subsequent encounter for fracture with nonunion: Secondary | ICD-10-CM | POA: Diagnosis not present

## 2023-12-08 DIAGNOSIS — S2241XK Multiple fractures of ribs, right side, subsequent encounter for fracture with nonunion: Secondary | ICD-10-CM | POA: Diagnosis not present

## 2023-12-08 MED ORDER — IBUPROFEN 400 MG PO TABS
600.0000 mg | ORAL_TABLET | Freq: Once | ORAL | Status: AC
  Administered 2023-12-08: 600 mg via ORAL
  Filled 2023-12-08: qty 1
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
## 2023-12-08 NOTE — Progress Notes (Signed)
 CSW received call from [place], EMT who states patient is in the lobby requesting assistance with drug detox.  CSW spoke with patient who confirms his request for substance abuse treatment. Patient states he is homeless and has lived in [place] for a few years. Patient confirms he is using fentanyl  and heroin via needle.  CSW spoke with [place] at [facility] in [place] who states there are four open detox beds at this time and patient can report directly to the facility for treatment. Beauford Bounds requested patient's AVS be faxed to [phone] - no other documentation needed. CSW sent AVS via fax.  CSW informed Sylvester Evert of address for facility and patient will be sent via cab for treatment.  Shepard Dicker, MSW, LCSW Transitions of Care  Clinical Social Worker II [phone]

## 2023-12-08 NOTE — Discharge Instructions (Signed)
 You may take over-the-counter medications such as ibuprofen  or acetaminophen  for rib pain.  Try to use the spirometer throughout the day if you are able.  I recommend using approximately hourly for the first week or so and slowly reducing the frequency of use.  This device helps you keep your lungs open. I have provided information on local residential and outpatient substance abuse treatment centers.  Please contact these facilities for assistance with cessation of narcotics.

## 2023-12-19 DIAGNOSIS — Z419 Encounter for procedure for purposes other than remedying health state, unspecified: Secondary | ICD-10-CM | POA: Diagnosis not present

## 2023-12-25 ENCOUNTER — Emergency Department (HOSPITAL_COMMUNITY)
Admission: EM | Admit: 2023-12-25 | Discharge: 2023-12-25 | Disposition: A | Discharge status: Home / Self Care | Attending: Emergency Medicine | Admitting: Emergency Medicine

## 2023-12-25 ENCOUNTER — Encounter (HOSPITAL_COMMUNITY): Payer: Self-pay

## 2023-12-25 ENCOUNTER — Other Ambulatory Visit: Payer: Self-pay

## 2023-12-25 DIAGNOSIS — T402X1A Poisoning by other opioids, accidental (unintentional), initial encounter: Secondary | ICD-10-CM | POA: Diagnosis not present

## 2023-12-25 DIAGNOSIS — Z5900 Homelessness unspecified: Secondary | ICD-10-CM | POA: Diagnosis not present

## 2023-12-25 DIAGNOSIS — T40601A Poisoning by unspecified narcotics, accidental (unintentional), initial encounter: Secondary | ICD-10-CM

## 2023-12-25 DIAGNOSIS — T401X1A Poisoning by heroin, accidental (unintentional), initial encounter: Secondary | ICD-10-CM | POA: Insufficient documentation

## 2023-12-25 DIAGNOSIS — R9431 Abnormal electrocardiogram [ECG] [EKG]: Secondary | ICD-10-CM | POA: Diagnosis not present

## 2023-12-25 MED ORDER — NALOXONE HCL 4 MG/0.1ML NA LIQD
1.0000 | Freq: Once | NASAL | Status: AC
  Administered 2023-12-25: 1 via NASAL
  Filled 2023-12-25: qty 4

## 2023-12-25 MED ORDER — NALOXONE HCL 0.4 MG/ML IJ SOLN: 0.4000 mg | Freq: Once | INTRAMUSCULAR | Status: DC

## 2023-12-25 NOTE — ED Provider Notes (Signed)
 Pt with opiate use - awake and alert Declines narcan - ambulatory and stable Pt desires d/c - seems to be able to make decisions [facility] - resource list given.     Early Glisson, MD 12/25/23 [phone]
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
## 2023-12-25 NOTE — ED Triage Notes (Signed)
 Pt BIB [facility] from the street for drug overdose. Pt is homeless. Pt reports used heroin this morning. Pt somnolent in triage. Pt arousable to voice. EMS Vitals BP 119/80  Pulse 70 RR 15  T 98.8 100% O2 on RA.

## 2023-12-25 NOTE — ED Notes (Signed)
 Help get patient on the monitor patient is resting with call bell in reach

## 2023-12-25 NOTE — Discharge Instructions (Signed)
 Please return to the ER for severe worsening symptoms, see the list below for substance abuse treatment programs   Substance Abuse Treatment Programs  Intensive Outpatient Programs [facility]     [address]      [place], [place]                   [phone]       [facility] [address] [place], [place] [phone]  [facility]     (Inpatient and outpatient)     [address]           [phone]    [facility] [phone] (Suboxone and Methadone)  [address]      [place], [place] [address]      [phone]       [address] [place], [place] [phone]  [facility] (Outpatient/Inpatient, [facility])    (insurance only) [phone]             [facility] (Groups & Residential) [place], [place] [phone]     [facility]     [address]     [place], [place]      [phone]       [facility] (for caregivers and family) [phone] [address] [place], [place] [phone]      Residential Treatment Programs [facility]      [address] [place], [place] [address]  [phone]       [facility] [address] [place], [place] [address] [phone]  [facility] [place]        [phone]       [facility] [phone]  [facility] ([facility]             [address]                                         [place], [place]                                                [phone] or [phone]                               [facility] [address] [place], [address] [phone]  [facility]    [address]      [place], [place]     [phone]       [facility] [address] [place], [place] [phone]  [facility]   [address] [place], [place]  [address]     [phone]      Admissions: 8am-3pm M-F  [facility] ([facility]) [address] [place], [place] [phone]  [facility]: Residential Program [phone] Days)   [place], [place]      [phone] or [phone]     [facility]: [facility] [place], [place] (Walk in Hours over the weekend or by referral)  [facility] [address] [place], [place], [place] [address] [phone]  Crisis Mobile: [facility]:  [phone] (for crisis response 24 hours  a day) [facility] Hotline:      [phone] Outpatient Psychiatry and Counseling  [facility]: Mobile Crisis Management 24 hours:  [phone]  [facility] [employer] sliding scale fee and walk in schedule: M-F 8am-12pm/1pm-3pm [address]  [place], [place] [address] [phone]  [facility] [address] [place], [place] [address] [phone]  [facility] ([facility])- new patient walk-in appointments available Monday - Friday 8am -3pm.          [address] [place], [place] [address] [phone] or crisis line- [phone]  [facility] Out[facility] Services/ Intensive Outpatient Therapy Program [address] [place], [place] [address] [phone]  [facility]                  Crisis Services      [phone] [address]     [place], [place] [address]                 [facility]   [facility] [phone]. [address] [place], [place] [address]   [facility]          [address] Zachery Hermes  [place], [place] [address]       [phone]  [facility] [address] [place], [place] [address] [phone]  [facility]    [address]    [place], [place] [address]     [phone]       Larance Plater,  MD     [address]     [place] [address]     [phone]       [facility] [address] [place] [address]  [facility]     [address]     [place], [place]      [phone]       [facility] Tommy Frames, MD [address] [place], [place] [address] [phone]  [facility]     [address]     [place], [place] [address]     [phone]       [facility] [address] [place], [place] [address] [phone] Resources for Temporary Residential Assistance/Crisis Centers  [facility]) M-F 8am-3pm   [address]  [place], [place] [address]   [phone] Services include: laundry, barbering, support groups, case management, phone  & computer access, showers, AA/NA mtgs, mental health/substance abuse nurse, job skills class, disability information, VA assistance, spiritual classes, etc.   HOMELESS SHELTERS  [facility]     [facility]   [address], [place]     [phone]              [employer] (women and children)       [address] [place], [place] [address] [phone] [email or web address] for application and process Application Required  [facility]   [address]    [place] [address]     [phone]                    [facility] [address] [place], [place] [address] [phone] application appt.) Application Required  [facility] (women only)    [address]     [place], [place] [address]     [phone]      Intake starts 6pm daily Need valid ID, SSC, & Police report [employer] [address] [place], [place] [phone] Application Required  [employer] (men only)     [address]      [place],  [place]     [phone]       [facility] (Pregnant women only) [address] [place], [place] [phone]  [facility]      [address]      [place], [place] [address]     [phone]             [facility] [address] [place], [place] [phone] 90 day commitment/SA/Application process  [facility](men only)     [address]     [place], [place]     [phone]       Check-in at [facility] [address] [place], [place] [address] [phone] Men/Women/Women and Children must be there by 7 pm  [facility] [place], [place] [phone]

## 2023-12-29 ENCOUNTER — Other Ambulatory Visit: Payer: Self-pay

## 2023-12-29 ENCOUNTER — Encounter (HOSPITAL_COMMUNITY): Payer: Self-pay | Admitting: Emergency Medicine

## 2023-12-29 ENCOUNTER — Emergency Department (HOSPITAL_COMMUNITY)
Admission: EM | Admit: 2023-12-29 | Discharge: 2023-12-29 | Disposition: A | Discharge status: Home / Self Care | Attending: Emergency Medicine | Admitting: Emergency Medicine

## 2023-12-29 DIAGNOSIS — Z743 Need for continuous supervision: Secondary | ICD-10-CM | POA: Diagnosis not present

## 2023-12-29 DIAGNOSIS — F17210 Nicotine dependence, cigarettes, uncomplicated: Secondary | ICD-10-CM | POA: Insufficient documentation

## 2023-12-29 DIAGNOSIS — S0990XA Unspecified injury of head, initial encounter: Secondary | ICD-10-CM | POA: Diagnosis not present

## 2023-12-29 DIAGNOSIS — Z041 Encounter for examination and observation following transport accident: Secondary | ICD-10-CM | POA: Diagnosis not present

## 2023-12-29 DIAGNOSIS — R519 Headache, unspecified: Secondary | ICD-10-CM | POA: Diagnosis not present

## 2023-12-29 MED ORDER — ACETAMINOPHEN 500 MG PO TABS
1000.0000 mg | ORAL_TABLET | Freq: Once | ORAL | Status: DC
  Filled 2023-12-29: qty 2
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
## 2023-12-29 NOTE — ED Notes (Addendum)
 Patient not found in hallway bed. Patient left before papers could be given and d/c vitals could be done.

## 2024-02-01 ENCOUNTER — Ambulatory Visit (HOSPITAL_COMMUNITY)
Admission: EM | Admit: 2024-02-01 | Discharge: 2024-02-01 | Disposition: A | Discharge status: Other Acute Inpatient Hospital | Source: Home / Self Care

## 2024-02-01 ENCOUNTER — Encounter (HOSPITAL_COMMUNITY): Payer: Self-pay

## 2024-02-01 ENCOUNTER — Emergency Department (HOSPITAL_COMMUNITY)
Admission: EM | Admit: 2024-02-01 | Discharge: 2024-02-01 | Disposition: A | Discharge status: Home / Self Care | Attending: Emergency Medicine | Admitting: Emergency Medicine

## 2024-02-01 ENCOUNTER — Other Ambulatory Visit: Payer: Self-pay

## 2024-02-01 ENCOUNTER — Other Ambulatory Visit (HOSPITAL_COMMUNITY): Payer: Self-pay

## 2024-02-01 ENCOUNTER — Emergency Department (HOSPITAL_COMMUNITY)
Admission: EM | Admit: 2024-02-01 | Discharge: 2024-02-01 | Disposition: A | Discharge status: Home / Self Care | Source: Home / Self Care | Attending: Emergency Medicine | Admitting: Emergency Medicine

## 2024-02-01 DIAGNOSIS — F1193 Opioid use, unspecified with withdrawal: Secondary | ICD-10-CM

## 2024-02-01 DIAGNOSIS — F1123 Opioid dependence with withdrawal: Secondary | ICD-10-CM | POA: Insufficient documentation

## 2024-02-01 DIAGNOSIS — R0602 Shortness of breath: Secondary | ICD-10-CM | POA: Insufficient documentation

## 2024-02-01 DIAGNOSIS — R000 Tachycardia, unspecified: Secondary | ICD-10-CM | POA: Insufficient documentation

## 2024-02-01 DIAGNOSIS — F1910 Other psychoactive substance abuse, uncomplicated: Secondary | ICD-10-CM | POA: Diagnosis not present

## 2024-02-01 DIAGNOSIS — R55 Syncope and collapse: Secondary | ICD-10-CM | POA: Diagnosis present

## 2024-02-01 LAB — COMPREHENSIVE METABOLIC PANEL WITH GFR
ALT: 23 U/L (ref 0–44)
AST: 24 U/L (ref 15–41)
Albumin: 3.3 g/dL — ABNORMAL LOW (ref 3.5–5.0)
Alkaline Phosphatase: 98 U/L (ref 38–126)
Anion gap: 13 (ref 5–15)
BUN: 10 mg/dL (ref 6–20)
CO2: 18 mmol/L — ABNORMAL LOW (ref 22–32)
Calcium: 9 mg/dL (ref 8.9–10.3)
Chloride: 104 mmol/L (ref 98–111)
Creatinine, Ser: 0.71 mg/dL (ref 0.61–1.24)
GFR, Estimated: >60 mL/min (ref >60)
Glucose, Bld: 114 mg/dL — ABNORMAL HIGH (ref 70–99)
Potassium: 3.6 mmol/L (ref 3.5–5.1)
Sodium: 135 mmol/L (ref 135–145)
Total Bilirubin: 0.5 mg/dL (ref 0.0–1.2)
Total Protein: 6.9 g/dL (ref 6.5–8.1)

## 2024-02-01 LAB — CBC
HCT: 39.2 % (ref 39.0–52.0)
Hemoglobin: 12.6 g/dL — ABNORMAL LOW (ref 13.0–17.0)
MCH: 26.1 pg (ref 26.0–34.0)
MCHC: 32.1 g/dL (ref 30.0–36.0)
MCV: 81.3 fL (ref 80.0–100.0)
Platelets: 397 K/uL (ref 150–400)
RBC: 4.82 MIL/uL (ref 4.22–5.81)
RDW: 13.4 % (ref 11.5–15.5)
WBC: 8.7 K/uL (ref 4.0–10.5)
nRBC: 0 % (ref 0.0–0.2)

## 2024-02-01 LAB — ETHANOL: Alcohol, Ethyl (B): <15 mg/dL (ref <15)

## 2024-02-01 LAB — CBG MONITORING, ED: Glucose-Capillary: 82 mg/dL (ref 70–99)

## 2024-02-01 MED ORDER — BUPRENORPHINE HCL-NALOXONE HCL 8-2 MG SL SUBL
1.0000 | SUBLINGUAL_TABLET | Freq: Once | SUBLINGUAL | Status: AC
  Administered 2024-02-01: 1 via SUBLINGUAL
  Filled 2024-02-01: qty 1

## 2024-02-01 MED ORDER — ONDANSETRON 4 MG PO TBDP: 4.0000 mg | ORAL_TABLET | Freq: Once | ORAL | Status: DC

## 2024-02-01 MED ORDER — BUPRENORPHINE HCL-NALOXONE HCL 8-2 MG SL FILM
1.0000 | ORAL_FILM | Freq: Four times a day (QID) | SUBLINGUAL | 0 refills | Status: AC | PRN
  Filled 2024-02-01: qty 10, 4d supply, fill #0

## 2024-02-01 MED ORDER — METOCLOPRAMIDE HCL 5 MG/ML IJ SOLN
10.0000 mg | Freq: Once | INTRAMUSCULAR | Status: AC
  Administered 2024-02-01: 10 mg via INTRAMUSCULAR
  Filled 2024-02-01: qty 2

## 2024-02-01 NOTE — ED Provider Notes (Cosign Needed Addendum)
 Behavioral Health Urgent Care Medical Screening Exam  Patient Name: Jay Hudson MRN: [id number] Date of Evaluation: 02/01/24 Chief Complaint:  Observe plan and lobby Diagnosis:  Final diagnoses:  Near syncope  Narcotic withdrawal (HCC)    History of Present illness: Jay Hudson is a 43 y.o. male.    Patient presented to [facility] via taxi walked into the lobby subsequently observed by staff in the floor shaking and stating that he was very very sick and having shortness of breath.  Patient reports that he was at [facility] emergency department and they provided him with a taxi voucher to come here for treatment.  Patient was treated in the ED with Suboxone due to narcotic withdrawal.  Patient is uncertain if he has had any narcotics within the last 24 hours.  Security observed patient with hypodermic needles in his pocket.  Patient's vital signs were taken, EMS called, patient is being transported to [facility] for medical evaluation and clearance.      Flowsheet Row ED from 02/01/2024 in [facility] Emergency Department at [facility] Most recent reading at 02/01/2024 11:28 AM ED from 02/01/2024 in [facility] Most recent reading at 02/01/2024 10:50 AM ED from 02/01/2024 in [facility] Emergency Department at [facility] Most recent reading at 02/01/2024  8:31 AM  C-SSRS RISK CATEGORY No Risk No Risk No Risk    Psychiatric Specialty Exam Unable to assess PSE due to med due to medical acuity of patient as he had a near syncopal episode while in the lobby prior to being triage or even checked and  Presentation  General Appearance:Disheveled  Eye Contact:Poor  Speech:No data recorded Speech Volume:No data recorded Handedness:No data recorded  Mood and Affect  Mood:No data recorded Affect:No data recorded  Thought Process  Thought Processes:No data recorded Descriptions of Associations:No data recorded Orientation:No data  recorded Thought Content:No data recorded   Hallucinations:No data recorded Ideas of Reference:No data recorded Suicidal Thoughts:No data recorded Homicidal Thoughts:No data recorded  Sensorium  Memory:No data recorded Judgment:No data recorded Insight:No data recorded  Executive Functions  Concentration:No data recorded Attention Span:No data recorded Recall:No data recorded Fund of Knowledge:No data recorded Language:No data recorded  Psychomotor Activity  Psychomotor Activity:No data recorded  Assets  Assets:No data recorded  Sleep  Sleep:No data recorded Number of hours: No data recorded  Physical Exam: Physical Exam Constitutional:      Appearance: He is toxic-appearing.  HENT:     Head: Normocephalic.     Nose: Nose normal.   Eyes:     Comments: Eyes were closed throughout most of the evaluation but patient is speaking slowly   Cardiovascular:     Rate and Rhythm: Regular rhythm. Tachycardia present.  Pulmonary:     Effort: Pulmonary effort is normal. Tachypnea present. No accessory muscle usage.     Breath sounds: Normal breath sounds and air entry.     Comments: Subjectively complaining of shortness of breath although breathing without distress  Skin:    General: Skin is warm and dry.   Neurological:     Mental Status: He is lethargic.     Motor: Weakness present.     Review of Systems  Unable to perform ROS: Acuity of condition    Blood pressure (!) 131/98, pulse (!) 101, temperature 97.6 F (36.4 C), resp. rate 20, SpO2 96%. There is no height or weight on file to calculate BMI.  Musculoskeletal: Strength & Muscle Tone: Unable to assess  as patient is laying in floor Gait & Station: Unable to assess this patient is lying on floor Patient leans: Unable to assess as patient is lying in floor   [facility] MSE Discharge Disposition for Follow up and Recommendations: Based on my evaluation the patient appears to have an emergency medical condition  for which I recommend the patient be transferred to the emergency department for further evaluation.   Patient with near syncopal episode in the lobby, EMS called, patient needs a medical evaluation given new medical event on arrival here at [facility] Excepting provider Dr. TEE It is unclear as to why the patient presented here to [facility] therefore once stabilized patient can determine whether or not he would like to return to receive any available services.   Suzen Lesches, NP 02/01/2024, 5:43 PM
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
## 2024-02-01 NOTE — ED Notes (Signed)
 Pt refused zofran . Requested cab voucher for [facility]. Voucher given to pt and taxi called. Pt waiting in lobby

## 2024-02-01 NOTE — ED Triage Notes (Signed)
 Pt called ems for opoid withdrawal. Pt feeling generalized body aches and nvd. Last used heroin last night. Denies si/hi

## 2024-02-01 NOTE — Progress Notes (Signed)
 Transition of Care [facility]) - Emergency Department Mini Assessment RNCM consulted in regards to medication assistance.  Pt has Medicaid insurance coverage and is not eligible for Medication Assistance Through [facility]) program. RNCM suggests sending Rx to [facility] at  [facility] Transitions of Care  to fill and bring to pt at bedside prior to discharge. No further CM needs communicated at this time.    Patient Details  Name: Jay Hudson MRN: [id number] Date of Birth: August 09, 1981  Transition of Care [facility]) CM/SW Contact:    Debarah Saunas, RN Phone Number: 02/01/2024, 12:27 PM   Clinical Narrative:    ED Mini Assessment: What brought you to the Emergency Department? : (P) transfer from [facility], opiod withdrawl  Barriers to Discharge: (P) ED Medication assistance, Barriers Resolved  Barrier interventions: (P) review medication benefits  Means of departure: (P) Public Transportation  Interventions which prevented an admission or readmission: (P) Medication Review    Patient Contact and Communications        ,          Patient states their goals for this hospitalization and ongoing recovery are:: (P) get his meds to get better      Admission diagnosis:  Opioid withdrawals Patient Active Problem List   Diagnosis Date Noted   Chronic pain of both shoulders 04/20/2018   Anxiety 04/20/2018   History of shingles 04/20/2018   PCP:  Joane Artist RAMAN, MD Pharmacy:   [facility] [address] [place] [address] Phone: [phone] Fax: [phone]

## 2024-02-01 NOTE — Discharge Planning (Signed)
 RNCM verified with [facility] that Rx was delivered at 1:30.
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
## 2024-02-01 NOTE — ED Provider Notes (Addendum)
 Patient arrived at behavioral care and they reported he was saying he was so sick and rolling around on the floor.  Patient arrived here he has been sleeping in the bed.  He has not had any episode of emesis.  Patient's vital signs are normal.  Do not feel the patient needs another emergency room evaluation.    12:30 PM Pt still with no vomiting and mentating normally.  Pt given short prescription of suboxone but no need for admission or further testing.   Doretha Folks, MD 02/01/24 8791    Doretha Folks, MD 02/01/24 [phone]

## 2024-02-01 NOTE — Progress Notes (Signed)
   02/01/24 1048  [facility] Triage Screening (Walk-ins at [facility] only)  What Is the Reason for Your Visit/Call Today? Pt walked in to [facility] stating he is having withdrawls. PT laid down on the floor, vitals were taken and EMS called. Pt appears to be unstable and weak.  How Long Has This Been Causing You Problems? <Week  Do you have any current medical co-morbidities that require immediate attention? Yes  Please describe current medical co-morbidities that require immediate attention: Pt is unstable  Clinician description of patient physical appearance/behavior: unstable, weak  What Do You Feel Would Help You the Most Today? Medication(s)  If access to [facility] Urgent Care was not available, would you have sought care in the Emergency Department? No  Determination of Need Emergent (2 hours)  Options For Referral [facility] Urgent Care  Determination of Need filed? Yes

## 2024-02-01 NOTE — ED Notes (Signed)
 Patient observed in waiting room. Upon arrive patient was laid out on the floor. States he was given suboxone at [facility] and sent here in a taxi. Patient vitals stable, triage MHT, RN, and NP at bedside with patient. EMS called, patient to be seen at [facility] for medical clearance. Patient left facility with EMS. Safety maintained.

## 2024-02-01 NOTE — ED Triage Notes (Signed)
 Patient bib EMS from [facility] after he fell asleep in their lobby. Patient was sent to the ED for evaluation. Patient last used IV drugs last night which he reports was fentanyl  and is having some withdrawal symptoms. Pt alert and orietned x4 on arrival.
